# Patient Record
Sex: Female | Born: 1990 | Race: White | Hispanic: No | Marital: Married | Smoking: Never smoker
Health system: Southern US, Community
[De-identification: ages and names within clinical notes are randomized; demographics above are authoritative.]

## PROBLEM LIST (undated history)

## (undated) DIAGNOSIS — E669 Obesity, unspecified: Secondary | ICD-10-CM

## (undated) DIAGNOSIS — E282 Polycystic ovarian syndrome: Secondary | ICD-10-CM

## (undated) DIAGNOSIS — J45909 Unspecified asthma, uncomplicated: Secondary | ICD-10-CM

## (undated) HISTORY — PX: TONSILLECTOMY: SUR1361

## (undated) HISTORY — PX: BLADDER SURGERY: SHX569

---

## 2017-06-03 ENCOUNTER — Emergency Department (INDEPENDENT_AMBULATORY_CARE_PROVIDER_SITE_OTHER)
Admission: EM | Admit: 2017-06-03 | Discharge: 2017-06-03 | Disposition: A | Discharge status: Home / Self Care | Payer: Self-pay | Source: Home / Self Care | Attending: Family Medicine | Admitting: Family Medicine

## 2017-06-03 ENCOUNTER — Other Ambulatory Visit: Payer: Self-pay

## 2017-06-03 ENCOUNTER — Encounter: Payer: Self-pay | Admitting: Emergency Medicine

## 2017-06-03 DIAGNOSIS — J111 Influenza due to unidentified influenza virus with other respiratory manifestations: Secondary | ICD-10-CM

## 2017-06-03 DIAGNOSIS — R69 Illness, unspecified: Secondary | ICD-10-CM

## 2017-06-03 HISTORY — DX: Polycystic ovarian syndrome: E28.2

## 2017-06-03 MED ORDER — METHYLPREDNISOLONE SODIUM SUCC 125 MG IJ SOLR
80.0000 mg | Freq: Once | INTRAMUSCULAR | Status: AC
  Administered 2017-06-03: 80 mg via INTRAMUSCULAR

## 2017-06-03 MED ORDER — BENZONATATE 200 MG PO CAPS: ORAL_CAPSULE | ORAL | 0 refills | Status: DC

## 2017-06-03 MED ORDER — AZITHROMYCIN 250 MG PO TABS: ORAL_TABLET | ORAL | 0 refills | Status: DC

## 2017-06-03 MED ORDER — PREDNISONE 20 MG PO TABS: ORAL_TABLET | ORAL | 0 refills | Status: DC

## 2017-06-03 NOTE — ED Provider Notes (Signed)
[facility]    CSN: [id number] Arrival date & time: 06/03/17  1408     History   Chief Complaint Chief Complaint  Patient presents with  . Influenza    HPI Dawn Hill is a 27 y.o. female.   Complains of 3 day history flu-like illness including myalgias, headache, chills, fatigue, and cough.  Also has mild nasal congestion and sore throat.  Cough is non-productive and somewhat worse at night.  No pleuritic pain or shortness of breath.  She has a history of asthma and pneumonia and notes that her URI's tend to linger.  She has been using her albuterol inhaler more frequently.   The history is provided by the patient.    Past Medical History:  Diagnosis Date  . PCOS (polycystic ovarian syndrome)     There are no active problems to display for this patient.   Past Surgical History:  Procedure Laterality Date  . BLADDER SURGERY    . TONSILLECTOMY      OB History    No data available       Home Medications    Prior to Admission medications   Medication Sig Start Date End Date Taking? Authorizing Provider  albuterol (ACCUNEB) 0.63 MG/3ML nebulizer solution Take 1 ampule by nebulization every 6 (six) hours as needed for wheezing.   Yes [provider]  azithromycin (ZITHROMAX Z-PAK) 250 MG tablet Take 2 tabs today; then begin one tab once daily for 4 more days. 06/03/17   Lattie HawBeese, Tammie Yanda A, MD  benzonatate (TESSALON) 200 MG capsule Take one cap by mouth at bedtime as needed for cough.  May repeat in 4 to 6 hours 06/03/17   Lattie HawBeese, Shian Goodnow A, MD  predniSONE (DELTASONE) 20 MG tablet Take one tab by mouth twice daily for 4 days, then one daily for 3 days. Take with food. 06/03/17   Lattie HawBeese, Blessed Cotham A, MD    Family History Family History  Problem Relation Age of Onset  . Fibromyalgia Mother   . Rheum arthritis Mother   . Bladder Cancer Mother   . Diabetes Father   . Hypertension Father     Social History Social History   Tobacco Use  .  Smoking status: Never Smoker  . Smokeless tobacco: Current User  Substance Use Topics  . Alcohol use: Yes  . Drug use: No     Allergies   Patient has no known allergies.   Review of Systems Review of Systems + sore throat + hoarse + cough No pleuritic pain + wheezing + nasal congestion + post-nasal drainage No sinus pain/pressure No itchy/red eyes ? earache No hemoptysis No SOB No fever, + chills No nausea No vomiting No abdominal pain No diarrhea No urinary symptoms No skin rash + fatigue + myalgias + headache Used OTC meds without relief   Physical Exam Triage Vital Signs ED Triage Vitals  Enc Vitals Group     BP 06/03/17 1502 121/85     Pulse Rate 06/03/17 1502 88     Resp --      Temp 06/03/17 1502 98 F (36.7 C)     Temp Source 06/03/17 1502 Oral     SpO2 06/03/17 1502 97 %     Weight 06/03/17 1503 281 lb (127.5 kg)     Height 06/03/17 1503 4\' 11"  (1.499 m)     Head Circumference --      Peak Flow --      Pain Score 06/03/17 1503 3  Pain Loc --      Pain Edu? --      Excl. in GC? --    No data found.  Updated Vital Signs BP 121/85 (BP Location: Right Arm)   Pulse 88   Temp 98 F (36.7 C) (Oral)   Ht 4\' 11"  (1.499 m)   Wt 281 lb (127.5 kg)   LMP 05/07/2017 (Exact Date)   SpO2 97%   BMI 56.76 kg/m   Visual Acuity Right Eye Distance:   Left Eye Distance:   Bilateral Distance:    Right Eye Near:   Left Eye Near:    Bilateral Near:     Physical Exam Nursing notes and Vital Signs reviewed. Appearance:  Patient appears stated age, and in no acute distress Eyes:  Pupils are equal, round, and reactive to light and accomodation.  Extraocular movement is intact.  Conjunctivae are not inflamed  Ears:  Canals normal.  Tympanic membranes normal.  Nose:  Mildly congested turbinates.  No sinus tenderness. Pharynx:  Uvula mildly edematous. Neck:  Supple.  Enlarged posterior/lateral nodes are palpated bilaterally, tender to palpation on  the left.   Lungs:  Clear to auscultation.  Breath sounds are equal.  Moving air well. Heart:  Regular rate and rhythm without murmurs, rubs, or gallops.  Abdomen:  Nontender without masses or hepatosplenomegaly.  Bowel sounds are present.  No CVA or flank tenderness.  Extremities:  No edema.  Skin:  No rash present.    UC Treatments / Results  Labs (all labs ordered are listed, but only abnormal results are displayed) Labs Reviewed - No data to display  EKG  EKG Interpretation None       Radiology No results found.  Procedures Procedures (including critical care time)  Medications Ordered in UC Medications  methylPREDNISolone sodium succinate (SOLU-MEDROL) 125 mg/2 mL injection 80 mg (not administered)     Initial Impression / Assessment and Plan / UC Course  I have reviewed the triage vital signs and the nursing notes.  Pertinent labs & imaging results that were available during my care of the patient were reviewed by me and considered in my medical decision making (see chart for details).    Patient has missed 48 hour window of opportunity for beginning Tamiflu.  Will begin empiric Z-pak. Administered Solumedrol 80mg  IM Prescription written for Benzonatate (Tessalon) to take at bedtime for night-time cough.  Begin prednisone Monday 06/04/17. Take plain guaifenesin (1200mg  extended release tabs such as Mucinex) twice daily, with plenty of water, for cough and congestion.  May add Pseudoephedrine (30mg , one or two every 4 to 6 hours) for sinus congestion.  Get adequate rest.   May use Afrin nasal spray (or generic oxymetazoline) each morning for about 5 days and then discontinue.  Also recommend using saline nasal spray several times daily and saline nasal irrigation (AYR is a common brand).  Use Flonase nasal spray each morning after using Afrin nasal spray and saline nasal irrigation. Try warm salt water gargles for sore throat.  Stop all antihistamines for now, and  other non-prescription cough/cold preparations. Continue albuterol inhaler as needed. Followup with Family Doctor if not improved in about 8 days.    Final Clinical Impressions(s) / UC Diagnoses   Final diagnoses:  Influenza-like illness    ED Discharge Orders        Ordered    azithromycin (ZITHROMAX Z-PAK) 250 MG tablet     06/03/17 1534    predniSONE (DELTASONE) 20 MG tablet  06/03/17 1534    benzonatate (TESSALON) 200 MG capsule     06/03/17 1534           Lattie Haw, MD 06/08/17 1447

## 2017-06-03 NOTE — ED Triage Notes (Signed)
Body aches, cough, bi-lateral ear pain, fatigue, chills, SOB x 3 days

## 2017-06-03 NOTE — Discharge Instructions (Signed)
Begin prednisone Monday 06/04/17. Take plain guaifenesin (1200mg  extended release tabs such as Mucinex) twice daily, with plenty of water, for cough and congestion.  May add Pseudoephedrine (30mg , one or two every 4 to 6 hours) for sinus congestion.  Get adequate rest.   May use Afrin nasal spray (or generic oxymetazoline) each morning for about 5 days and then discontinue.  Also recommend using saline nasal spray several times daily and saline nasal irrigation (AYR is a common brand).  Use Flonase nasal spray each morning after using Afrin nasal spray and saline nasal irrigation. Try warm salt water gargles for sore throat.  Stop all antihistamines for now, and other non-prescription cough/cold preparations. Continue albuterol inhaler as needed.

## 2017-07-04 ENCOUNTER — Ambulatory Visit (HOSPITAL_COMMUNITY)
Admission: RE | Admit: 2017-07-04 | Discharge: 2017-07-04 | Disposition: A | Discharge status: Home / Self Care | Payer: Self-pay | Source: Ambulatory Visit | Attending: Vascular Surgery | Admitting: Vascular Surgery

## 2017-07-04 ENCOUNTER — Other Ambulatory Visit (HOSPITAL_COMMUNITY): Payer: Self-pay | Admitting: Gerontology

## 2017-07-04 DIAGNOSIS — S81832A Puncture wound without foreign body, left lower leg, initial encounter: Secondary | ICD-10-CM

## 2017-07-04 DIAGNOSIS — S71132A Puncture wound without foreign body, left thigh, initial encounter: Secondary | ICD-10-CM | POA: Insufficient documentation

## 2017-07-04 DIAGNOSIS — W540XXA Bitten by dog, initial encounter: Secondary | ICD-10-CM

## 2017-07-09 ENCOUNTER — Other Ambulatory Visit (HOSPITAL_COMMUNITY): Payer: Self-pay | Admitting: Gerontology

## 2017-07-09 ENCOUNTER — Ambulatory Visit (HOSPITAL_COMMUNITY)
Admission: RE | Admit: 2017-07-09 | Discharge: 2017-07-09 | Disposition: A | Discharge status: Home / Self Care | Payer: Self-pay | Source: Ambulatory Visit | Attending: Surgery | Admitting: Surgery

## 2017-07-09 DIAGNOSIS — S71152A Open bite, left thigh, initial encounter: Secondary | ICD-10-CM | POA: Insufficient documentation

## 2017-07-09 DIAGNOSIS — W540XXA Bitten by dog, initial encounter: Secondary | ICD-10-CM | POA: Insufficient documentation

## 2017-07-09 DIAGNOSIS — S71132A Puncture wound without foreign body, left thigh, initial encounter: Secondary | ICD-10-CM | POA: Insufficient documentation

## 2017-07-18 ENCOUNTER — Encounter (HOSPITAL_COMMUNITY): Payer: Self-pay

## 2017-07-18 ENCOUNTER — Emergency Department (HOSPITAL_COMMUNITY)
Admission: EM | Admit: 2017-07-18 | Discharge: 2017-07-19 | Disposition: A | Discharge status: Home / Self Care | Payer: Self-pay | Attending: Emergency Medicine | Admitting: Emergency Medicine

## 2017-07-18 DIAGNOSIS — R1031 Right lower quadrant pain: Secondary | ICD-10-CM

## 2017-07-18 DIAGNOSIS — Z3A01 Less than 8 weeks gestation of pregnancy: Secondary | ICD-10-CM | POA: Insufficient documentation

## 2017-07-18 DIAGNOSIS — O26891 Other specified pregnancy related conditions, first trimester: Secondary | ICD-10-CM | POA: Insufficient documentation

## 2017-07-18 DIAGNOSIS — Z3490 Encounter for supervision of normal pregnancy, unspecified, unspecified trimester: Secondary | ICD-10-CM

## 2017-07-18 LAB — COMPREHENSIVE METABOLIC PANEL
ALK PHOS: 57 U/L (ref 38–126)
ALT: 31 U/L (ref 14–54)
ANION GAP: 12 (ref 5–15)
AST: 26 U/L (ref 15–41)
Albumin: 4.1 g/dL (ref 3.5–5.0)
BUN: 6 mg/dL (ref 6–20)
CALCIUM: 9.6 mg/dL (ref 8.9–10.3)
CHLORIDE: 103 mmol/L (ref 101–111)
CO2: 21 mmol/L — AB (ref 22–32)
Creatinine, Ser: 0.71 mg/dL (ref 0.44–1.00)
GFR calc non Af Amer: >60 mL/min (ref >60)
Glucose, Bld: 78 mg/dL (ref 65–99)
Potassium: 3.6 mmol/L (ref 3.5–5.1)
SODIUM: 136 mmol/L (ref 135–145)
Total Bilirubin: 1.4 mg/dL — ABNORMAL HIGH (ref 0.3–1.2)
Total Protein: 8.2 g/dL — ABNORMAL HIGH (ref 6.5–8.1)

## 2017-07-18 LAB — CBC
HCT: 42.8 % (ref 36.0–46.0)
HEMOGLOBIN: 14.4 g/dL (ref 12.0–15.0)
MCH: 29.9 pg (ref 26.0–34.0)
MCHC: 33.6 g/dL (ref 30.0–36.0)
MCV: 89 fL (ref 78.0–100.0)
Platelets: 328 10*3/uL (ref 150–400)
RBC: 4.81 MIL/uL (ref 3.87–5.11)
RDW: 13.2 % (ref 11.5–15.5)
WBC: 13.3 10*3/uL — ABNORMAL HIGH (ref 4.0–10.5)

## 2017-07-18 LAB — I-STAT BETA HCG BLOOD, ED (MC, WL, AP ONLY): I-stat hCG, quantitative: >2000 m[IU]/mL — ABNORMAL HIGH (ref <5)

## 2017-07-18 LAB — LIPASE, BLOOD: Lipase: 26 U/L (ref 11–51)

## 2017-07-18 LAB — URINALYSIS, ROUTINE W REFLEX MICROSCOPIC
Bilirubin Urine: NEGATIVE
GLUCOSE, UA: NEGATIVE mg/dL
Ketones, ur: 20 mg/dL — AB
Nitrite: NEGATIVE
PROTEIN: NEGATIVE mg/dL
Specific Gravity, Urine: 1.012 (ref 1.005–1.030)
pH: 6 (ref 5.0–8.0)

## 2017-07-18 NOTE — ED Provider Notes (Signed)
[facility] EMERGENCY DEPARTMENT Provider Note   CSN: [id number] Arrival date & time: 07/18/17  2002     History   Chief Complaint Chief Complaint  Patient presents with  . Abdominal Pain    HPI Dawn Hill is a 27 y.o. female.  HPI   Reports has had abdominal pain since Friday, initially off and on after eating. Nausea since Friday. Has been taking zofran which hasn't helped. Was on abx for 3wk for dog bite.  Constant pain began yesterday. Stabbing pain going from umbilicus to RLQ.  No vomiting but severe nausea. No diarrhea. No BM in 2 days but is normal.  No vaginal bleeding or discharge.  January was LMP, has PCOS.  LMP around 1/15.  Past Medical History:  Diagnosis Date  . PCOS (polycystic ovarian syndrome)     There are no active problems to display for this patient.   Past Surgical History:  Procedure Laterality Date  . BLADDER SURGERY    . TONSILLECTOMY       OB History   None      Home Medications    Prior to Admission medications   Medication Sig Start Date End Date Taking? Authorizing Provider  albuterol (PROVENTIL HFA;VENTOLIN HFA) 108 (90 Base) MCG/ACT inhaler Inhale 1-2 puffs into the lungs every 6 (six) hours as needed for wheezing or shortness of breath.   Yes [provider]  ondansetron (ZOFRAN) 4 MG tablet Take 4 mg by mouth every 6 (six) hours as needed for nausea or vomiting.   Yes [provider]  oxyCODONE (ROXICODONE) 5 MG immediate release tablet Take 5 mg by mouth every 4 (four) hours as needed for severe pain.   Yes [provider]  azithromycin (ZITHROMAX Z-PAK) 250 MG tablet Take 2 tabs today; then begin one tab once daily for 4 more days. Patient not taking: Reported on 07/18/2017 06/03/17   Lattie Haw, MD  benzonatate (TESSALON) 200 MG capsule Take one cap by mouth at bedtime as needed for cough.  May repeat in 4 to 6 hours Patient not taking: Reported on 07/18/2017 06/03/17   Lattie Haw, MD  predniSONE (DELTASONE) 20 MG tablet Take one tab by mouth twice daily for 4 days, then one daily for 3 days. Take with food. Patient not taking: Reported on 07/18/2017 06/03/17   Lattie Haw, MD    Family History Family History  Problem Relation Age of Onset  . Fibromyalgia Mother   . Rheum arthritis Mother   . Bladder Cancer Mother   . Diabetes Father   . Hypertension Father     Social History Social History   Tobacco Use  . Smoking status: Never Smoker  . Smokeless tobacco: Current User  Substance Use Topics  . Alcohol use: Yes  . Drug use: No     Allergies   Ibuprofen   Review of Systems Review of Systems  Constitutional: Positive for appetite change and fatigue. Negative for fever.  HENT: Negative for sore throat.   Eyes: Negative for visual disturbance.  Respiratory: Negative for cough and shortness of breath.   Cardiovascular: Negative for chest pain.  Gastrointestinal: Positive for abdominal pain, constipation and nausea. Negative for diarrhea and vomiting.  Genitourinary: Negative for difficulty urinating, dysuria, vaginal bleeding and vaginal discharge.  Musculoskeletal: Negative for back pain and neck pain.  Skin: Negative for rash.  Neurological: Negative for syncope and headaches.     Physical Exam Updated Vital Signs BP 121/73  Pulse 87   Temp 98.3 F (36.8 C) (Oral)   Resp 16   Ht 4\' 11"  (1.499 m)   Wt 127.9 kg (282 lb)   SpO2 97%   BMI 56.96 kg/m   Physical Exam  Constitutional: She is oriented to person, place, and time. She appears well-developed and well-nourished. No distress.  HENT:  Head: Normocephalic and atraumatic.  Eyes: Conjunctivae and EOM are normal.  Neck: Normal range of motion.  Cardiovascular: Normal rate, regular rhythm, normal heart sounds and intact distal pulses. Exam reveals no gallop and no friction rub.  No murmur heard. Pulmonary/Chest: Effort normal and breath sounds normal. No respiratory  distress. She has no wheezes. She has no rales.  Abdominal: Soft. She exhibits no distension. There is tenderness in the right lower quadrant. There is no guarding and negative Murphy's sign.  Musculoskeletal: She exhibits no edema or tenderness.  Neurological: She is alert and oriented to person, place, and time.  Skin: Skin is warm and dry. No rash noted. She is not diaphoretic. No erythema.  Nursing note and vitals reviewed.    ED Treatments / Results  Labs (all labs ordered are listed, but only abnormal results are displayed) Labs Reviewed  COMPREHENSIVE METABOLIC PANEL - Abnormal; Notable for the following components:      Result Value   CO2 21 (*)    Total Protein 8.2 (*)    Total Bilirubin 1.4 (*)    All other components within normal limits  CBC - Abnormal; Notable for the following components:   WBC 13.3 (*)    All other components within normal limits  URINALYSIS, ROUTINE W REFLEX MICROSCOPIC - Abnormal; Notable for the following components:   APPearance HAZY (*)    Hgb urine dipstick LARGE (*)    Ketones, ur 20 (*)    Leukocytes, UA MODERATE (*)    Bacteria, UA RARE (*)    Squamous Epithelial / LPF 6-30 (*)    All other components within normal limits  HCG, QUANTITATIVE, PREGNANCY - Abnormal; Notable for the following components:   hCG, Beta Chain, Quant, S 50,115 (*)    All other components within normal limits  I-STAT BETA HCG BLOOD, ED ([facility], [facility], [facility] ONLY) - Abnormal; Notable for the following components:   I-stat hCG, quantitative >2,000.0 (*)    All other components within normal limits  URINE CULTURE  LIPASE, BLOOD  ABO/RH    EKG None  Radiology US Pelvic Doppler (torsion R/o Or Mass Arterial Flow)  Result Date: 07/19/2017 CLINICAL DATA:  Right lower quadrant abdominal pain. Estimated gestational age by LMP is 11 weeks 2 days. Quantitative beta HCG is 50,115 EXAM: OBSTETRIC <14 WK [place] AND TRANSVAGINAL OB US DOPPLER ULTRASOUND OF OVARIES TECHNIQUE: Both  transabdominal and transvaginal ultrasound examinations were performed for complete evaluation of the gestation as well as the maternal uterus, adnexal regions, and pelvic cul-de-sac. Transvaginal technique was performed to assess early pregnancy. Color and duplex Doppler ultrasound was utilized to evaluate blood flow to the ovaries. COMPARISON:  None. FINDINGS: Intrauterine gestational sac: A single intrauterine gestational sac is present. Yolk sac:  Yolk sac is identified. Embryo:  Fetal pole is present. Cardiac Activity: Fetal cardiac activity is observed. Heart Rate: 123 bpm CRL: 8.3 mm   6 w 6 d                  [place] EDC: 03/08/2018 Subchorionic hemorrhage:  None visualized. Maternal uterus/adnexae: Uterus is anteverted. No myometrial mass lesions identified.  Small nabothian cysts in the cervix. Both ovaries are identified and have normal appearance. No abnormal adnexal masses. No abnormal pelvic fluid collections. Pulsed Doppler evaluation of both ovaries demonstrates normal appearing low-resistance arterial and venous waveforms. IMPRESSION: 1. Single intrauterine pregnancy. Estimated gestational age by crown-rump length is 6 weeks 6 days. No acute complication is identified sonographically. 2. Normal ultrasound appearance of the ovaries. No evidence of ovarian torsion. Electronically Signed   By: Burman Nieves M.D.   On: 07/19/2017 02:29   US Ob Less Than 14 Weeks With Ob Transvaginal  Result Date: 07/19/2017 CLINICAL DATA:  Right lower quadrant abdominal pain. Estimated gestational age by LMP is 11 weeks 2 days. Quantitative beta HCG is 50,115 EXAM: OBSTETRIC <14 WK [place] AND TRANSVAGINAL OB US DOPPLER ULTRASOUND OF OVARIES TECHNIQUE: Both transabdominal and transvaginal ultrasound examinations were performed for complete evaluation of the gestation as well as the maternal uterus, adnexal regions, and pelvic cul-de-sac. Transvaginal technique was performed to assess early pregnancy. Color and duplex Doppler  ultrasound was utilized to evaluate blood flow to the ovaries. COMPARISON:  None. FINDINGS: Intrauterine gestational sac: A single intrauterine gestational sac is present. Yolk sac:  Yolk sac is identified. Embryo:  Fetal pole is present. Cardiac Activity: Fetal cardiac activity is observed. Heart Rate: 123 bpm CRL: 8.3 mm   6 w 6 d                  [place] EDC: 03/08/2018 Subchorionic hemorrhage:  None visualized. Maternal uterus/adnexae: Uterus is anteverted. No myometrial mass lesions identified. Small nabothian cysts in the cervix. Both ovaries are identified and have normal appearance. No abnormal adnexal masses. No abnormal pelvic fluid collections. Pulsed Doppler evaluation of both ovaries demonstrates normal appearing low-resistance arterial and venous waveforms. IMPRESSION: 1. Single intrauterine pregnancy. Estimated gestational age by crown-rump length is 6 weeks 6 days. No acute complication is identified sonographically. 2. Normal ultrasound appearance of the ovaries. No evidence of ovarian torsion. Electronically Signed   By: Burman Nieves M.D.   On: 07/19/2017 02:29    Procedures Procedures (including critical care time)  Medications Ordered in ED Medications - No data to display   Initial Impression / Assessment and Plan / ED Course  I have reviewed the triage vital signs and the nursing notes.  Pertinent labs & imaging results that were available during my care of the patient were reviewed by me and considered in my medical decision making (see chart for details).      27 year old female with a history of PCOS presents with concern for right lower quadrant pain.  Pregnancy test positive with LMP in January.  Ultrasound was completed which shows single intrauterine pregnancy 6wk 6d.  No torsion. Heterotopic pregnancy with ectopic very unlikely and no sign of adnexal abnormalities on [place].  Urinalysis shows contamination, however possible urinary tract infection. Will call in keflex to  patient's pharmacy. Overall, patient is afebrile with intermittent symptoms for nearly one week followed by constant pain and feel it is unlikely her pain represents appendicitis, however have given her strict return precautions. No RUQ pain to suggest cholecystitis or cholelithiasis.  Pain may be related to pregnancy, possible UTI.  Recommend unisom/b6 for nausea, prenatal vitamins, tylenol for pain.  Called in keflex for 7 days. Patient discharged in stable condition with understanding of reasons to return.    Final Clinical Impressions(s) / ED Diagnoses   Final diagnoses:  Right lower quadrant abdominal pain  Intrauterine pregnancy    ED  Discharge Orders    None       Alvira MondaySchlossman, Lastacia Solum, MD 07/19/17 Windell [id number]

## 2017-07-18 NOTE — ED Triage Notes (Signed)
Pt states that since Friday she has been unable to eat due to nausea and sharp pain in lower R abd, reports no BM in two days. No fevers

## 2017-07-19 ENCOUNTER — Other Ambulatory Visit (HOSPITAL_COMMUNITY): Payer: Self-pay

## 2017-07-19 ENCOUNTER — Emergency Department (HOSPITAL_COMMUNITY): Payer: Self-pay

## 2017-07-19 LAB — ABO/RH: ABO/RH(D): O POS

## 2017-07-19 LAB — HCG, QUANTITATIVE, PREGNANCY: hCG, Beta Chain, Quant, S: 50115 m[IU]/mL — ABNORMAL HIGH

## 2017-07-19 NOTE — Discharge Instructions (Addendum)
You may take over the counter unisom (doxylamine) 25mg  nightly and vitamin B6 for nausea.

## 2017-07-19 NOTE — ED Notes (Addendum)
Pt states nausea still strongly present, but is requesting a Sprite.

## 2017-07-19 NOTE — ED Notes (Signed)
Patient transported to Ultrasound 

## 2017-07-19 NOTE — ED Notes (Signed)
ED Provider at bedside. 

## 2017-07-21 LAB — URINE CULTURE: Culture: >=100000 — AB

## 2017-07-22 ENCOUNTER — Telehealth: Payer: Self-pay

## 2017-07-22 NOTE — Progress Notes (Signed)
ED Antimicrobial Stewardship Positive Culture Follow Up   Ennis Fortsmber Mccloud is an 27 y.o. female who [facility] with a chief complaint of  Chief Complaint  Patient presents with  . Abdominal Pain    Recent Results (from the past 720 hour(s))  Urine culture     Status: Abnormal   Collection Time: 07/19/17  1:23 AM  Result Value Ref Range Status   Specimen Description URINE, RANDOM  Final   Special Requests NONE  Final   Culture (A)  Final    >=100,000 COLONIES/mL ENTEROBACTER CLOACAE >=100,000 COLONIES/mL DIPHTHEROIDS(CORYNEBACTERIUM SPECIES) Standardized susceptibility testing for this organism is not available. Performed at [facility], [address] [place], [place] [address]    Report Status 07/21/2017 FINAL  Final   Organism ID, Bacteria ENTEROBACTER CLOACAE (A)  Final      Susceptibility   Enterobacter cloacae - MIC*    CEFAZOLIN >=64 RESISTANT Resistant     CEFTRIAXONE <=1 SENSITIVE Sensitive     CIPROFLOXACIN <=0.25 SENSITIVE Sensitive     GENTAMICIN <=1 SENSITIVE Sensitive     IMIPENEM <=0.25 SENSITIVE Sensitive     NITROFURANTOIN 32 SENSITIVE Sensitive     TRIMETH/SULFA <=20 SENSITIVE Sensitive     PIP/TAZO 16 SENSITIVE Sensitive     * >=100,000 COLONIES/mL ENTEROBACTER CLOACAE    [x]  Treated with Keflex, organism resistant to prescribed antimicrobial  New antibiotic prescription: D/c Keflex and change to Macrobid 100 mg po bid x 7 days (safe in pregnancy). Ensure follow-up with OBGYN.  ED Provider: Sharen Hecklaudia Gibbons, PA-C  Rolley SimsMartin, Kupono Marling Ann 07/22/2017, 10:10 AM Infectious Diseases Pharmacist Phone# [phone]

## 2017-07-22 NOTE — Telephone Encounter (Signed)
Post ED Visit - Positive Culture Follow-up: Successful Patient Follow-Up  Culture assessed and recommendations reviewed by: []  Enzo BiNathan Batchelder, Pharm.D. []  Celedonio MiyamotoJeremy Frens, Pharm.D., BCPS AQ-ID []  Garvin FilaMike Maccia, Pharm.D., BCPS [x]  Georgina PillionElizabeth Martin, Pharm.D., BCPS []  BrentMinh Pham, [address]Pharm.D., BCPS, AAHIVP []  Estella HuskMichelle Turner, Pharm.D., BCPS, AAHIVP []  Lysle Pearlachel Rumbarger, PharmD, BCPS []  Casilda Carlsaylor Stone, PharmD, BCPS []  Pollyann SamplesAndy Johnston, PharmD, BCPS  Positive urine culture  []  Patient discharged without antimicrobial prescription and treatment is now indicated [x]  Organism is resistant to prescribed ED discharge antimicrobial []  Patient with positive blood cultures  Changes discussed with ED provider: Sharen [facility]laudia Gibbons [facility] New antibiotic prescription Macrobid 100 mg BID x 7 days [facility] [phone]  Contacted patient, date 07/22/17, time 1111   Jerry CarasCullom, Shatavia Santor Burnett 07/22/2017, 11:09 AM

## 2017-08-16 ENCOUNTER — Encounter: Payer: Self-pay | Admitting: Vascular Surgery

## 2020-01-24 ENCOUNTER — Emergency Department (INDEPENDENT_AMBULATORY_CARE_PROVIDER_SITE_OTHER)
Admission: RE | Admit: 2020-01-24 | Discharge: 2020-01-24 | Disposition: A | Discharge status: Home / Self Care | Payer: BC Managed Care – PPO | Source: Ambulatory Visit

## 2020-01-24 ENCOUNTER — Other Ambulatory Visit: Payer: Self-pay

## 2020-01-24 VITALS — BP 152/95 | HR 98 | Temp 98.4°F | Resp 16 | Ht 59.75 in | Wt 290.0 lb

## 2020-01-24 DIAGNOSIS — R3129 Other microscopic hematuria: Secondary | ICD-10-CM | POA: Diagnosis not present

## 2020-01-24 DIAGNOSIS — R300 Dysuria: Secondary | ICD-10-CM

## 2020-01-24 LAB — POCT URINALYSIS DIP (MANUAL ENTRY)
Bilirubin, UA: NEGATIVE
Glucose, UA: NEGATIVE mg/dL
Ketones, POC UA: NEGATIVE mg/dL
Leukocytes, UA: NEGATIVE
Nitrite, UA: NEGATIVE
Protein Ur, POC: NEGATIVE mg/dL
Spec Grav, UA: >=1.03 — AB (ref 1.010–1.025)
Urobilinogen, UA: 0.2 E.U./dL
pH, UA: 5 (ref 5.0–8.0)

## 2020-01-24 LAB — POCT URINE PREGNANCY: Preg Test, Ur: NEGATIVE

## 2020-01-24 MED ORDER — FLUCONAZOLE 150 MG PO TABS: 150.0000 mg | ORAL_TABLET | Freq: Once | ORAL | 0 refills | Status: AC

## 2020-01-24 MED ORDER — CEPHALEXIN 500 MG PO CAPS: 500.0000 mg | ORAL_CAPSULE | Freq: Three times a day (TID) | ORAL | 0 refills | Status: DC

## 2020-01-24 MED ORDER — PHENAZOPYRIDINE HCL 200 MG PO TABS: 200.0000 mg | ORAL_TABLET | Freq: Three times a day (TID) | ORAL | 0 refills | Status: DC | PRN

## 2020-01-24 NOTE — ED Triage Notes (Signed)
Patient here for day 2 of dysuria, urgency; working on fertility with PCOS and slightly late menses this month. Has not had covid vaccination.

## 2020-01-24 NOTE — ED Provider Notes (Signed)
[facility]    CSN: [id number] Arrival date & time: 01/24/20  1151      History   Chief Complaint Chief Complaint  Patient presents with  . Dysuria    HPI Dawn [facility] is a 29 y.o. female.   This is an established [place] urgent care patient, 29 year old woman, who presents with symptoms of urinary tract infection.  Patient has had multiple urinary tract infections since she was a child.  She has no back pain, fever, nausea, vomiting, or rigors.  Patient here for day 2 of dysuria, urgency; working on fertility with PCOS and slightly late menses this month. Has not had covid vaccination.     Past Medical History:  Diagnosis Date  . PCOS (polycystic ovarian syndrome)   . PCOS (polycystic ovarian syndrome)     There are no problems to display for this patient.   Past Surgical History:  Procedure Laterality Date  . BLADDER SURGERY    . TONSILLECTOMY      OB History   No obstetric history on file.      Home Medications    Prior to Admission medications   Medication Sig Start Date End Date Taking? Authorizing Provider  loratadine (CLARITIN) 10 MG tablet Take 10 mg by mouth daily.   Yes [provider]  metFORMIN (GLUCOPHAGE) 500 MG tablet Take by mouth daily with breakfast.   Yes [provider]  Prenatal Vit-Fe Fumarate-FA (PRENATAL 1 PLUS 1 PO) Take by mouth.   Yes [provider]  albuterol (PROVENTIL HFA;VENTOLIN HFA) 108 (90 Base) MCG/ACT inhaler Inhale 1-2 puffs into the lungs every 6 (six) hours as needed for wheezing or shortness of breath.    [provider]  cephALEXin (KEFLEX) 500 MG capsule Take 1 capsule (500 mg total) by mouth 3 (three) times daily. 01/24/20   Elvina Sidle, MD  fluconazole (DIFLUCAN) 150 MG tablet Take 1 tablet (150 mg total) by mouth once for 1 dose. Repeat if needed 01/24/20 01/24/20  Elvina Sidle, MD  phenazopyridine (PYRIDIUM) 200 MG tablet Take 1 tablet (200 mg total) by  mouth 3 (three) times daily as needed for pain. 01/24/20   Elvina Sidle, MD    Family History Family History  Problem Relation Age of Onset  . Fibromyalgia Mother   . Rheum arthritis Mother   . Bladder Cancer Mother   . Diabetes Father   . Hypertension Father     Social History Social History   Tobacco Use  . Smoking status: Never Smoker  . Smokeless tobacco: Current User  Substance Use Topics  . Alcohol use: Yes  . Drug use: No     Allergies   Ibuprofen   Review of Systems Review of Systems  Genitourinary: Positive for dysuria and frequency.  All other systems reviewed and are negative.    Physical Exam Triage Vital Signs ED Triage Vitals  Enc Vitals Group     BP 01/24/20 1212 (!) 152/95     Pulse Rate 01/24/20 1212 98     Resp 01/24/20 1212 16     Temp 01/24/20 1212 98.4 F (36.9 C)     Temp Source 01/24/20 1212 Oral     SpO2 01/24/20 1212 98 %     Weight 01/24/20 1215 290 lb (131.5 kg)     Height 01/24/20 1215 4' 11.75" (1.518 m)     Head Circumference --      Peak Flow --      Pain Score 01/24/20 1214 7  Pain Loc --      Pain Edu? --      Excl. in GC? --    No data found.  Updated Vital Signs BP (!) 152/95 (BP Location: Right Wrist)   Pulse 98   Temp 98.4 F (36.9 C) (Oral)   Resp 16   Ht 4' 11.75" (1.518 m)   Wt 131.5 kg   LMP 12/17/2019 (Exact Date)   SpO2 98%   BMI 57.11 kg/m    Physical Exam Vitals and nursing note reviewed.  Constitutional:      Appearance: Normal appearance. She is obese.  Eyes:     Conjunctiva/sclera: Conjunctivae normal.  Pulmonary:     Effort: Pulmonary effort is normal.  Musculoskeletal:        General: Normal range of motion.     Cervical back: Normal range of motion and neck supple.  Skin:    General: Skin is warm and dry.  Neurological:     General: No focal deficit present.     Mental Status: She is alert and oriented to person, place, and time.  Psychiatric:        Mood and Affect: Mood  normal.        Behavior: Behavior normal.        Thought Content: Thought content normal.        Judgment: Judgment normal.      UC Treatments / Results  Labs (all labs ordered are listed, but only abnormal results are displayed) Labs Reviewed  POCT URINALYSIS DIP (MANUAL ENTRY) - Abnormal; Notable for the following components:      Result Value   Spec Grav, UA >=1.030 (*)    Blood, UA moderate (*)    All other components within normal limits  URINE CULTURE  POCT URINE PREGNANCY    EKG   Radiology No results found.  Procedures Procedures (including critical care time)  Medications Ordered in UC Medications - No data to display  Initial Impression / Assessment and Plan / UC Course  I have reviewed the triage vital signs and the nursing notes.  Pertinent labs & imaging results that were available during my care of the patient were reviewed by me and considered in my medical decision making (see chart for details).    Final Clinical Impressions(s) / UC Diagnoses   Final diagnoses:  Dysuria  Microscopic hematuria   Discharge Instructions   None    ED Prescriptions    Medication Sig Dispense Auth. Provider   cephALEXin (KEFLEX) 500 MG capsule Take 1 capsule (500 mg total) by mouth 3 (three) times daily. 21 capsule Elvina Sidle, MD   fluconazole (DIFLUCAN) 150 MG tablet Take 1 tablet (150 mg total) by mouth once for 1 dose. Repeat if needed 2 tablet Elvina Sidle, MD   phenazopyridine (PYRIDIUM) 200 MG tablet Take 1 tablet (200 mg total) by mouth 3 (three) times daily as needed for pain. 10 tablet Elvina Sidle, MD     I have reviewed the PDMP during this encounter.   Elvina Sidle, MD 01/24/20 1230

## 2020-01-26 LAB — URINE CULTURE
MICRO NUMBER:: 11054171
SPECIMEN QUALITY:: ADEQUATE

## 2020-01-29 ENCOUNTER — Emergency Department (INDEPENDENT_AMBULATORY_CARE_PROVIDER_SITE_OTHER)
Admission: RE | Admit: 2020-01-29 | Discharge: 2020-01-29 | Disposition: A | Discharge status: Home / Self Care | Payer: BC Managed Care – PPO | Source: Ambulatory Visit

## 2020-01-29 ENCOUNTER — Telehealth: Payer: Self-pay

## 2020-01-29 ENCOUNTER — Other Ambulatory Visit: Payer: Self-pay

## 2020-01-29 ENCOUNTER — Emergency Department (INDEPENDENT_AMBULATORY_CARE_PROVIDER_SITE_OTHER): Payer: BC Managed Care – PPO

## 2020-01-29 VITALS — BP 128/77 | HR 97 | Temp 100.1°F | Resp 19 | Ht 59.0 in | Wt 290.0 lb

## 2020-01-29 DIAGNOSIS — R3129 Other microscopic hematuria: Secondary | ICD-10-CM

## 2020-01-29 DIAGNOSIS — R109 Unspecified abdominal pain: Secondary | ICD-10-CM | POA: Diagnosis not present

## 2020-01-29 HISTORY — DX: Obesity, unspecified: E66.9

## 2020-01-29 HISTORY — DX: Unspecified asthma, uncomplicated: J45.909

## 2020-01-29 LAB — POCT URINALYSIS DIP (MANUAL ENTRY)
Bilirubin, UA: NEGATIVE
Glucose, UA: NEGATIVE mg/dL
Ketones, POC UA: NEGATIVE mg/dL
Leukocytes, UA: NEGATIVE
Nitrite, UA: NEGATIVE
Protein Ur, POC: NEGATIVE mg/dL
Spec Grav, UA: >=1.03 — AB (ref 1.010–1.025)
Urobilinogen, UA: 0.2 E.U./dL
pH, UA: 6 (ref 5.0–8.0)

## 2020-01-29 LAB — POCT URINE PREGNANCY: Preg Test, Ur: NEGATIVE

## 2020-01-29 MED ORDER — CIPROFLOXACIN HCL 500 MG PO TABS: 250.0000 mg | ORAL_TABLET | Freq: Two times a day (BID) | ORAL | 0 refills | Status: DC

## 2020-01-29 MED ORDER — CIPROFLOXACIN HCL 250 MG PO TABS: 250.0000 mg | ORAL_TABLET | Freq: Two times a day (BID) | ORAL | 0 refills | Status: DC

## 2020-01-29 NOTE — Telephone Encounter (Signed)
Pharmacy changed per pt request

## 2020-01-29 NOTE — ED Triage Notes (Signed)
Pt presents f/up from 10/09 when she was treated for UTI with Keflex x10 days. Currently day 5/10. She reports bilateral flank pain that radiates to pelvis. Rates 10/10 "stabbing" pain. Dysuria as well. No otc today.

## 2020-01-29 NOTE — Discharge Instructions (Addendum)
While the antibiotic you were on appears to be appropriate for your most recent UTI, a new antibiotic has been prescribed to help better treat the infection due to your worsening symptoms. Be sure to take this medication with food to help prevent stomach upset.   Please take your antibiotic as prescribed. A urine culture has been sent to check the severity of your urinary infection and to determine if you are on the most appropriate antibiotic. The results should come back within 2-3 days. You will only be notified if a medication change is indicated.  Please follow up with family medicine or urology if not improving within 1 week, sooner if symptoms worsening.    Call 911 or have someone drive you to the hospital if symptoms significantly worsening.

## 2020-01-29 NOTE — Telephone Encounter (Signed)
Per pharmacy, 250mg  tablets of cipro are on back order. Per [place], medication changed to 500mg  tablets broken in half. Still take 250mg  by mouth twice per day.

## 2020-01-29 NOTE — ED Provider Notes (Signed)
[facility]    CSN: [id number] Arrival date & time: 01/29/20  1354      History   Chief Complaint Chief Complaint  Patient presents with  . Flank Pain    bilateral radiates to pelvic pain    HPI [facility] is a 29 y.o. female.   HPI  [facility] is a 28 y.o. female presenting to UC with c/o intermittent bilateral flank pain that radiates into lower abdomen.  She was seen at [facility] on 01/24/20, dx with a UTI, started on 10 day course of Keflex. Pt is on day 5.  Symptoms were improving until about 2 days ago flank pain started.  Pain is stabbing, 10/10 at worst.  Continued dysuria.  Denies known fever but had a temp of 100.1*F in triage. Denies current abdominal pain or vaginal symptoms. Pt had negative urine pregnancy test last week.    Past Medical History:  Diagnosis Date  . Asthma   . Obesity   . PCOS (polycystic ovarian syndrome)   . PCOS (polycystic ovarian syndrome)     There are no problems to display for this patient.   Past Surgical History:  Procedure Laterality Date  . BLADDER SURGERY    . TONSILLECTOMY      OB History   No obstetric history on file.      Home Medications    Prior to Admission medications   Medication Sig Start Date End Date Taking? Authorizing Provider  albuterol (PROVENTIL HFA;VENTOLIN HFA) 108 (90 Base) MCG/ACT inhaler Inhale 1-2 puffs into the lungs every 6 (six) hours as needed for wheezing or shortness of breath.    [provider]  ciprofloxacin (CIPRO) 250 MG tablet Take 1 tablet (250 mg total) by mouth 2 (two) times daily for 5 days. 01/29/20 02/03/20  Lurene Shadow, PA-C  loratadine (CLARITIN) 10 MG tablet Take 10 mg by mouth daily.    [provider]  metFORMIN (GLUCOPHAGE) 500 MG tablet Take by mouth daily with breakfast.    [provider]  phenazopyridine (PYRIDIUM) 200 MG tablet Take 1 tablet (200 mg total) by mouth 3 (three) times daily as needed for pain. 01/24/20    Elvina Sidle, MD  Prenatal Vit-Fe Fumarate-FA (PRENATAL 1 PLUS 1 PO) Take by mouth.    [provider]    Family History Family History  Problem Relation Age of Onset  . Fibromyalgia Mother   . Rheum arthritis Mother   . Bladder Cancer Mother   . Diabetes Father   . Hypertension Father     Social History Social History   Tobacco Use  . Smoking status: Never Smoker  . Smokeless tobacco: Current User  Substance Use Topics  . Alcohol use: Yes  . Drug use: No     Allergies   Ibuprofen   Review of Systems Review of Systems  Constitutional: Positive for fever. Negative for chills.  HENT: Negative for congestion, ear pain, sore throat, trouble swallowing and voice change.   Respiratory: Negative for cough and shortness of breath.   Cardiovascular: Negative for chest pain and palpitations.  Gastrointestinal: Positive for abdominal pain. Negative for diarrhea, nausea and vomiting.  Genitourinary: Positive for dysuria and flank pain. Negative for frequency, hematuria, urgency, vaginal bleeding, vaginal discharge and vaginal pain.  Musculoskeletal: Negative for arthralgias, back pain and myalgias.  Skin: Negative for rash.  All other systems reviewed and are negative.    Physical Exam Triage Vital Signs ED Triage Vitals  Enc Vitals Group  BP 01/29/20 1422 128/77     Pulse Rate 01/29/20 1422 97     Resp 01/29/20 1422 19     Temp 01/29/20 1422 100.1 F (37.8 C)     Temp Source 01/29/20 1422 Oral     SpO2 01/29/20 1422 98 %     Weight 01/29/20 1419 290 lb (131.5 kg)     Height 01/29/20 1419 4\' 11"  (1.499 m)     Head Circumference --      Peak Flow --      Pain Score 01/29/20 1424 10     Pain Loc --      Pain Edu? --      Excl. in GC? --    No data found.  Updated Vital Signs BP 128/77 (BP Location: Left Arm)   Pulse 97   Temp 100.1 F (37.8 C) (Oral)   Resp 19   Ht 4\' 11"  (1.499 m)   Wt 290 lb (131.5 kg)   LMP 12/17/2019 (Exact Date)   SpO2  98%   BMI 58.57 kg/m   Visual Acuity Right Eye Distance:   Left Eye Distance:   Bilateral Distance:    Right Eye Near:   Left Eye Near:    Bilateral Near:     Physical Exam Vitals and nursing note reviewed.  Constitutional:      General: She is not in acute distress.    Appearance: Normal appearance. She is well-developed. She is obese. She is not ill-appearing, toxic-appearing or diaphoretic.  HENT:     Head: Normocephalic and atraumatic.  Cardiovascular:     Rate and Rhythm: Normal rate and regular rhythm.  Pulmonary:     Effort: Pulmonary effort is normal. No respiratory distress.     Breath sounds: Normal breath sounds.  Abdominal:     General: There is no distension.     Palpations: Abdomen is soft.     Tenderness: There is no abdominal tenderness. There is no right CVA tenderness or left CVA tenderness.  Musculoskeletal:        General: Normal range of motion.     Cervical back: Normal range of motion.  Skin:    General: Skin is warm and dry.  Neurological:     Mental Status: She is alert and oriented to person, place, and time.  Psychiatric:        Behavior: Behavior normal.      UC Treatments / Results  Labs (all labs ordered are listed, but only abnormal results are displayed) Labs Reviewed  POCT URINALYSIS DIP (MANUAL ENTRY) - Abnormal; Notable for the following components:      Result Value   Color, UA other (*)    Clarity, UA cloudy (*)    Spec Grav, UA >=1.030 (*)    Blood, UA small (*)    All other components within normal limits  POCT URINE PREGNANCY    EKG   Radiology CT Renal Stone Study  Result Date: 01/29/2020 CLINICAL DATA:  Bilateral flank pain.  Recent UTI EXAM: CT ABDOMEN AND PELVIS WITHOUT CONTRAST TECHNIQUE: Multidetector CT imaging of the abdomen and pelvis was performed following the standard protocol without IV contrast. COMPARISON:  None. FINDINGS: Lower chest: No acute abnormality. Hepatobiliary: Unremarkable unenhanced  appearance of the liver. No focal liver lesion identified. Gallbladder is partially contracted but appears otherwise unremarkable. No biliary dilatation. Pancreas: Unremarkable. No pancreatic ductal dilatation or surrounding inflammatory changes. Spleen: Normal in size without focal abnormality. Adrenals/Urinary Tract: Unremarkable adrenal glands. Bilateral  kidneys have an unremarkable unenhanced appearance. No perinephric stranding or fluid. No renal lesion, stone, or hydronephrosis. Bilateral ureters are normal in appearance. No ureteral calculi. Urinary bladder is decompressed, limiting its evaluation. Stomach/Bowel: Stomach is within normal limits. Appendix appears normal. No evidence of bowel wall thickening, distention, or inflammatory changes. Vascular/Lymphatic: No significant vascular findings are identified. No enlarged abdominal or pelvic lymph nodes. Reproductive: Uterus and bilateral adnexa are unremarkable. Other: No free fluid. No abdominopelvic fluid collection. No pneumoperitoneum. No abdominal wall hernia. Musculoskeletal: No acute or significant osseous findings. IMPRESSION: No acute abdominopelvic findings. Specifically, no evidence of obstructive uropathy. Please note that evaluation for pyelonephritis is limited in the absence of intravenous contrast. Electronically Signed   By: Duanne Guess D.O.   On: 01/29/2020 15:34    Procedures Procedures (including critical care time)  Medications Ordered in UC Medications - No data to display  Initial Impression / Assessment and Plan / UC Course  I have reviewed the triage vital signs and the nursing notes.  Pertinent labs & imaging results that were available during my care of the patient were reviewed by me and considered in my medical decision making (see chart for details).     Reassured pt of no renal stones. UA still shows small hematuria New culture sent Will change pt to Cipro due to possible early pyelonephritis    Encouraged f/u with PCP Discussed symptoms that warrant emergent care in the ED. AVS given  Final Clinical Impressions(s) / UC Diagnoses   Final diagnoses:  Flank pain  Microscopic hematuria     Discharge Instructions     While the antibiotic you were on appears to be appropriate for your most recent UTI, a new antibiotic has been prescribed to help better treat the infection due to your worsening symptoms. Be sure to take this medication with food to help prevent stomach upset.   Please take your antibiotic as prescribed. A urine culture has been sent to check the severity of your urinary infection and to determine if you are on the most appropriate antibiotic. The results should come back within 2-3 days. You will only be notified if a medication change is indicated.  Please follow up with family medicine or urology if not improving within 1 week, sooner if symptoms worsening.    Call 911 or have someone drive you to the hospital if symptoms significantly worsening.      ED Prescriptions    Medication Sig Dispense Auth. Provider   ciprofloxacin (CIPRO) 250 MG tablet Take 1 tablet (250 mg total) by mouth 2 (two) times daily for 5 days. 10 tablet Lurene Shadow, PA-C     PDMP not reviewed this encounter.   Lurene Shadow, [place] 01/29/20 1641

## 2021-10-31 IMAGING — CT CT RENAL STONE PROTOCOL
2 of 4 series · 16 of 46 positions shown, 18 images · non-contrast
Comparison: None.

CLINICAL DATA: Bilateral flank pain.  Recent UTI

EXAM:
CT ABDOMEN AND PELVIS WITHOUT CONTRAST
TECHNIQUE: Multidetector CT imaging of the abdomen and pelvis was performed
following the standard protocol without IV contrast.

[Series 2: axial st · axial · 0.96mm/px · z∈[-662,-177]mm · 13 of 107 slices shown, 15 images]
[im 5/107  soft-tissue]
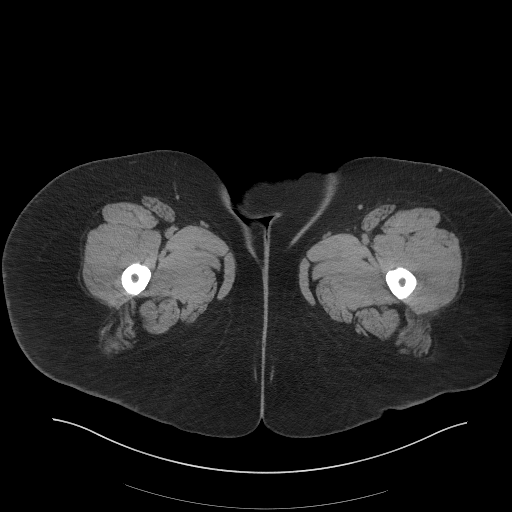
[im 5/107  bone]
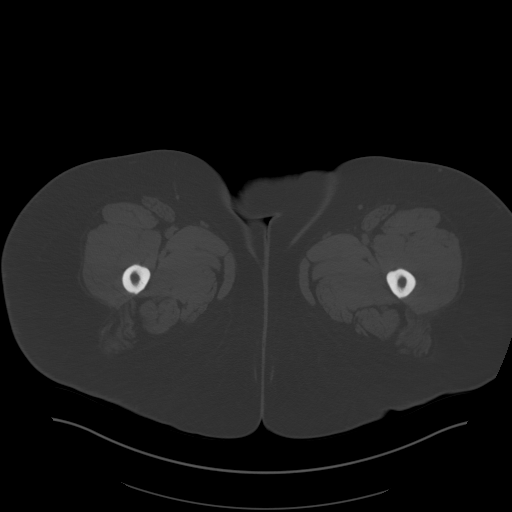
[im 14/107  soft-tissue]
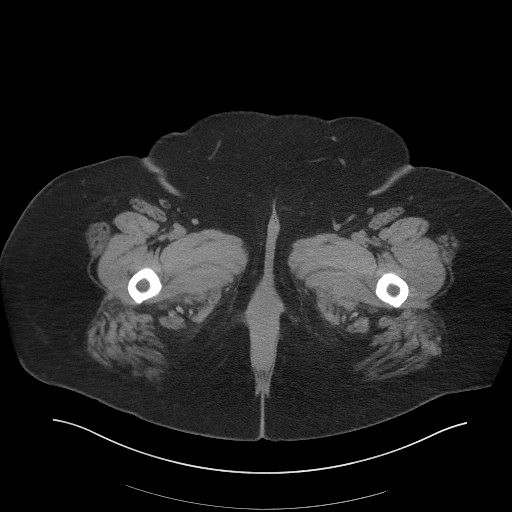
[im 23/107  soft-tissue]
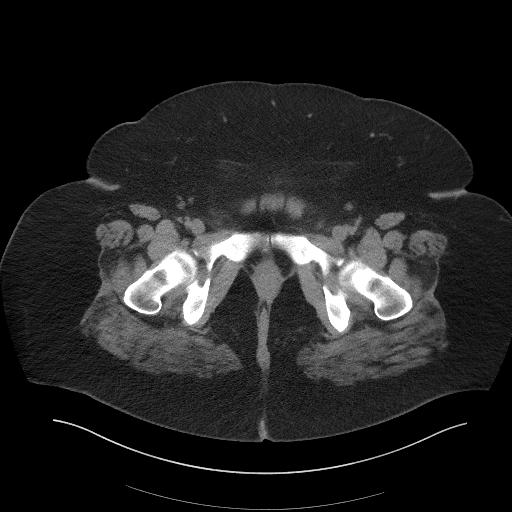
[im 31/107  soft-tissue]
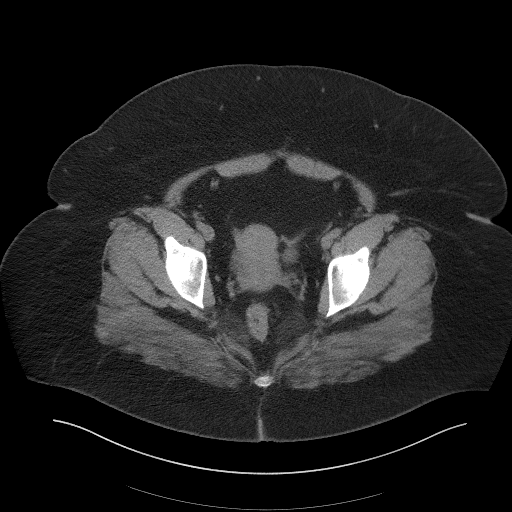
[im 36/107  soft-tissue]
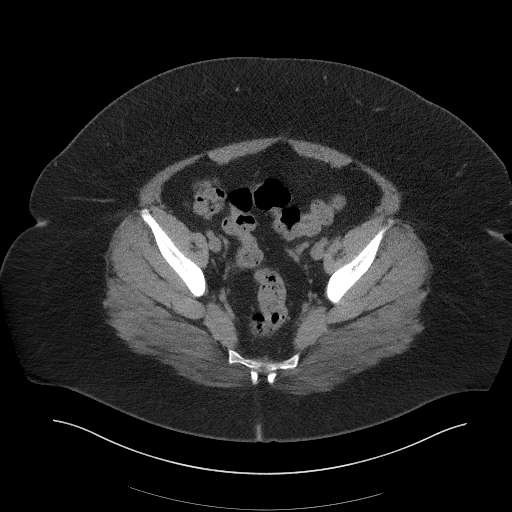
[im 45/107  soft-tissue]
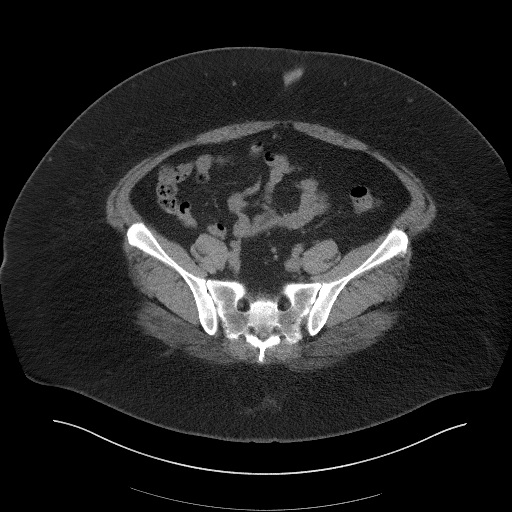
[im 54/107  soft-tissue]
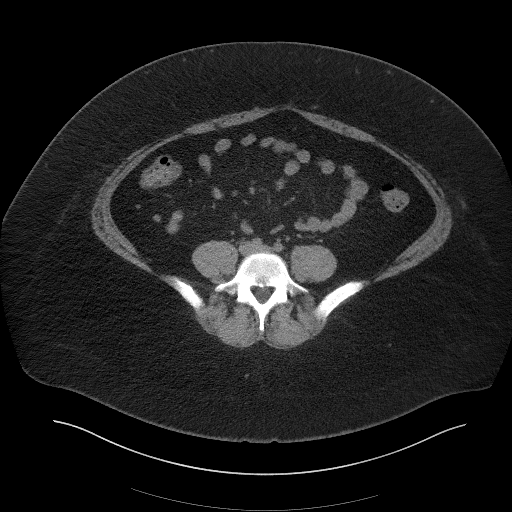
[im 62/107  soft-tissue]
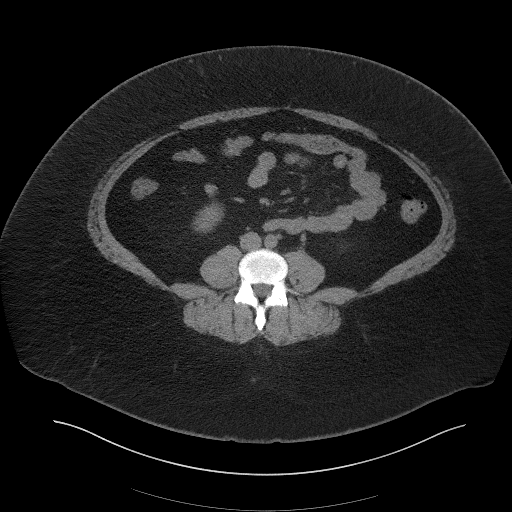
[im 71/107  soft-tissue]
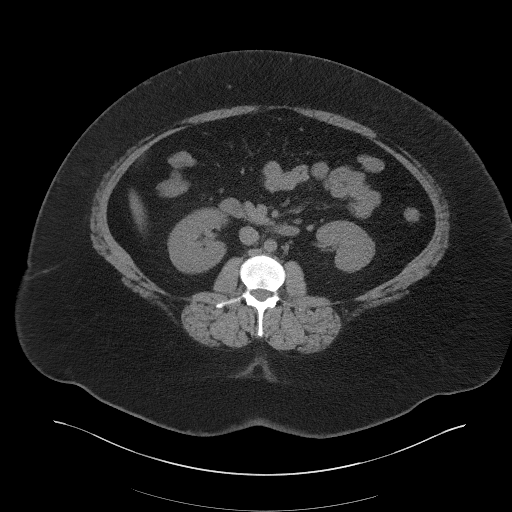
[im 71/107  bone]
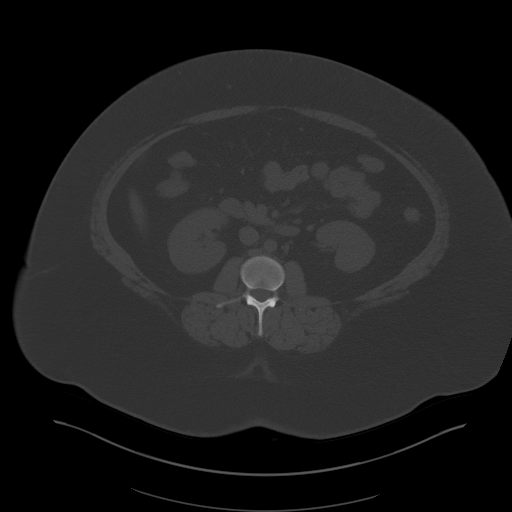
[im 76/107  soft-tissue]
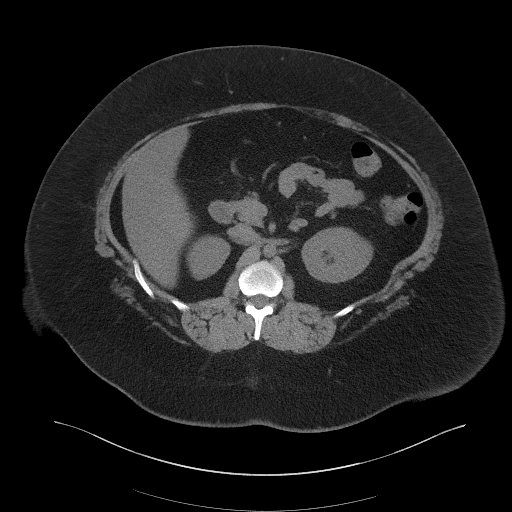
[im 84/107  soft-tissue]
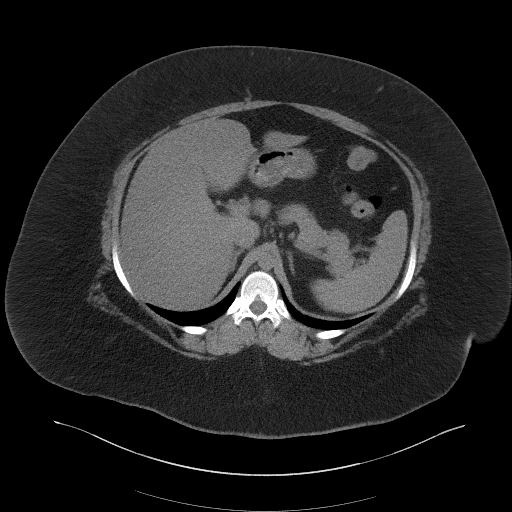
[im 93/107  soft-tissue]
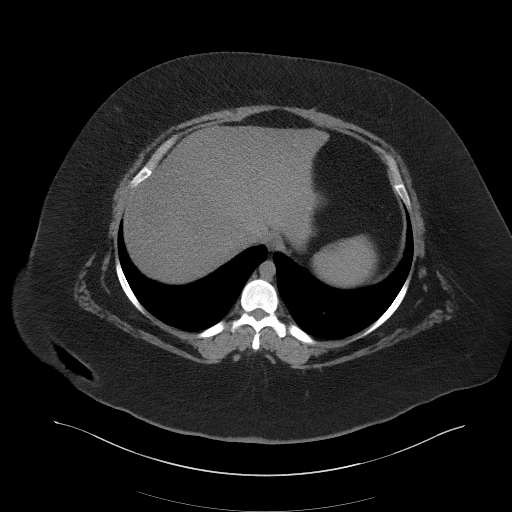
[im 102/107  soft-tissue]
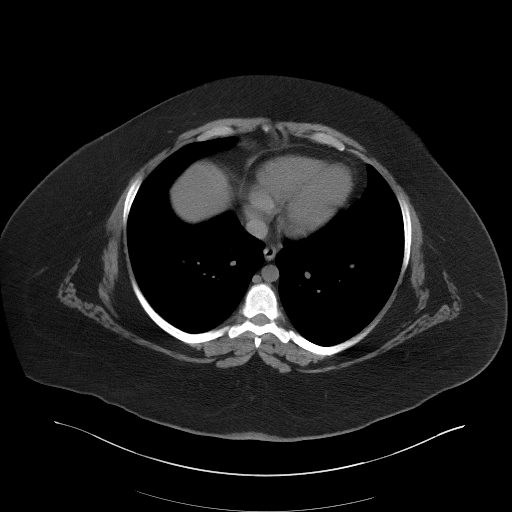

[Series 4: coronal st · coronal · 0.82mm/px · 3 of 128 slices shown]
[im 43/128  soft-tissue]
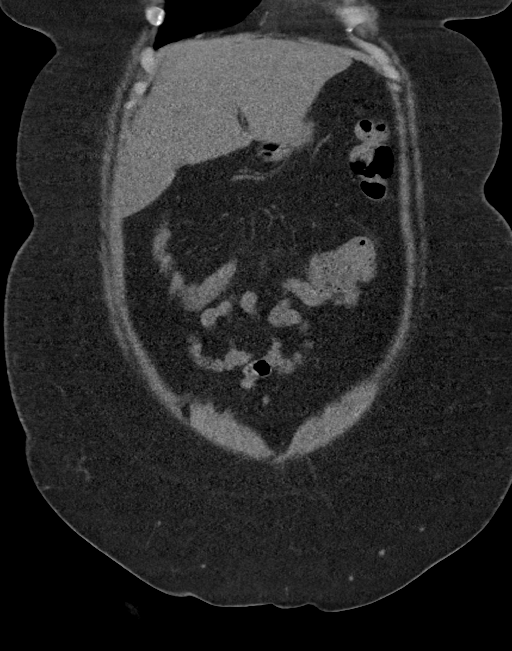
[im 57/128  soft-tissue]
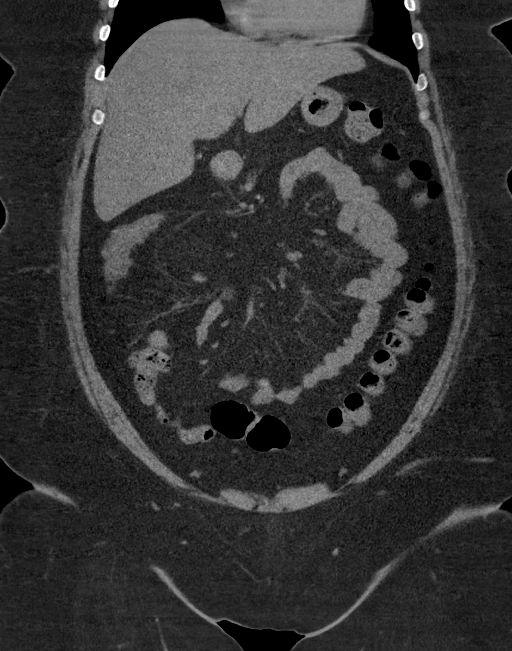
[im 71/128  soft-tissue]
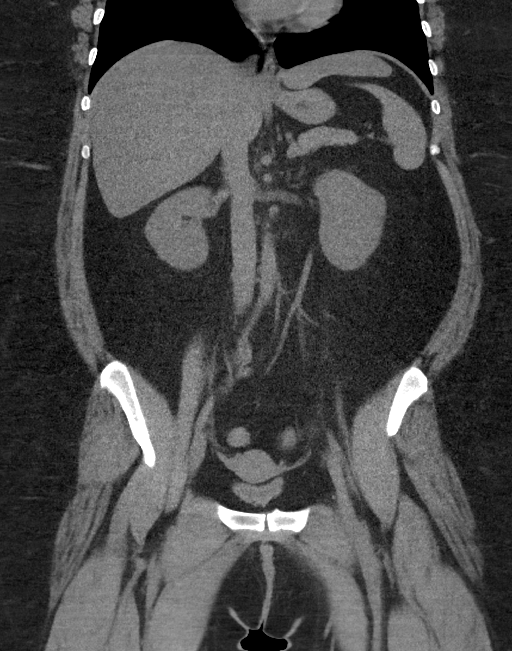

[16 of 46 positions shown; findings below may reference images not displayed]

FINDINGS: Lower chest: No acute abnormality.

Hepatobiliary: Unremarkable unenhanced appearance of the liver. No
focal liver lesion identified. Gallbladder is partially contracted
but appears otherwise unremarkable. No biliary dilatation.

Pancreas: Unremarkable. No pancreatic ductal dilatation or
surrounding inflammatory changes.

Spleen: Normal in size without focal abnormality.

Adrenals/Urinary Tract: Unremarkable adrenal glands. Bilateral
kidneys have an unremarkable unenhanced appearance. No perinephric
stranding or fluid. No renal lesion, stone, or hydronephrosis.
Bilateral ureters are normal in appearance. No ureteral calculi.
Urinary bladder is decompressed, limiting its evaluation.

Stomach/Bowel: Stomach is within normal limits. Appendix appears
normal. No evidence of bowel wall thickening, distention, or
inflammatory changes.

Vascular/Lymphatic: No significant vascular findings are identified.
No enlarged abdominal or pelvic lymph nodes.

Reproductive: Uterus and bilateral adnexa are unremarkable.

Other: No free fluid. No abdominopelvic fluid collection. No
pneumoperitoneum. No abdominal wall hernia.

Musculoskeletal: No acute or significant osseous findings.
IMPRESSION: No acute abdominopelvic findings. Specifically, no evidence of
obstructive uropathy. Please note that evaluation for pyelonephritis
is limited in the absence of intravenous contrast.

## 2022-06-11 ENCOUNTER — Encounter: Payer: Self-pay | Admitting: Emergency Medicine

## 2022-06-11 ENCOUNTER — Other Ambulatory Visit: Payer: Self-pay

## 2022-06-11 ENCOUNTER — Ambulatory Visit: Admit: 2022-06-11 | Payer: BC Managed Care – PPO

## 2022-06-11 ENCOUNTER — Ambulatory Visit
Admission: EM | Admit: 2022-06-11 | Discharge: 2022-06-11 | Disposition: A | Discharge status: Home / Self Care | Payer: BC Managed Care – PPO | Attending: Emergency Medicine | Admitting: Emergency Medicine

## 2022-06-11 DIAGNOSIS — J309 Allergic rhinitis, unspecified: Secondary | ICD-10-CM

## 2022-06-11 MED ORDER — METHYLPREDNISOLONE ACETATE 80 MG/ML IJ SUSP
80.0000 mg | Freq: Once | INTRAMUSCULAR | Status: AC
  Administered 2022-06-11: 80 mg via INTRAMUSCULAR

## 2022-06-11 MED ORDER — FLUTICASONE PROPIONATE 50 MCG/ACT NA SUSP: 1.0000 | Freq: Every day | NASAL | 2 refills | Status: AC

## 2022-06-11 MED ORDER — ALBUTEROL SULFATE HFA 108 (90 BASE) MCG/ACT IN AERS: 2.0000 | INHALATION_SPRAY | Freq: Four times a day (QID) | RESPIRATORY_TRACT | 2 refills | Status: AC | PRN

## 2022-06-11 MED ORDER — LORATADINE 10 MG PO TABS: 10.0000 mg | ORAL_TABLET | Freq: Every day | ORAL | 1 refills | Status: AC

## 2022-06-11 NOTE — ED Provider Notes (Signed)
[facility]    CSN: [id number] Arrival date & time: 06/11/22  1528    HISTORY   Chief Complaint  Patient presents with   Nasal Congestion   Cough   HPI Dawn Hill is a pleasant, 32 y.o. female who presents to urgent care today. Patient complains of nasal congestion, clear rhinorrhea, nonproductive cough for the past 4 days.  Patient reports a history of allergies and asthma, states not currently using any Claritin as previously prescribed.  Patient denies fever, body aches, chills, nausea, vomiting, diarrhea, known sick contacts.  Patient's heart rate is elevated on arrival, otherwise she is afebrile and has normal O2 sats.  Patient states has been taking over-the-counter cold and flu preparations and has used her albuterol inhaler once which she states helped a little bit.    The history is provided by the patient.   Past Medical History:  Diagnosis Date   Asthma    Obesity    PCOS (polycystic ovarian syndrome)    There are no problems to display for this patient.  Past Surgical History:  Procedure Laterality Date   BLADDER SURGERY     TONSILLECTOMY     OB History   No obstetric history on file.    Home Medications    Prior to Admission medications   Medication Sig Start Date End Date Taking? Authorizing Provider  albuterol (PROVENTIL HFA;VENTOLIN HFA) 108 (90 Base) MCG/ACT inhaler Inhale 1-2 puffs into the lungs every 6 (six) hours as needed for wheezing or shortness of breath.    [provider]  ciprofloxacin (CIPRO) 500 MG tablet Take 0.5 tablets (250 mg total) by mouth every 12 (twelve) hours. 01/29/20   Noe Gens, PA-C  loratadine (CLARITIN) 10 MG tablet Take 10 mg by mouth daily.    [provider]  metFORMIN (GLUCOPHAGE) 500 MG tablet Take by mouth daily with breakfast.    [provider]  phenazopyridine (PYRIDIUM) 200 MG tablet Take 1 tablet (200 mg total) by mouth 3 (three) times daily as needed for pain.  01/24/20   Robyn Haber, MD  Prenatal Vit-Fe Fumarate-FA (PRENATAL 1 PLUS 1 PO) Take by mouth.    [provider]    Family History Family History  Problem Relation Age of Onset   Fibromyalgia Mother    Rheum arthritis Mother    Bladder Cancer Mother    Diabetes Father    Hypertension Father    Macular degeneration Father    Social History Social History   Tobacco Use   Smoking status: Never   Smokeless tobacco: Current  Substance Use Topics   Alcohol use: Yes   Drug use: No   Allergies   Ibuprofen  Review of Systems Review of Systems Pertinent findings revealed after performing a 14 point review of systems has been noted in the history of present illness.  Physical Exam Vital Signs BP (!) 137/100 (BP Location: Right Arm)   Pulse (!) 105   Temp 97.6 F (36.4 C) (Oral)   Resp 20   Ht '4\' 11"'$  (1.499 m)   Wt 218 lb (98.9 kg)   LMP 06/10/2022   SpO2 100%   BMI 44.03 kg/m   No data found.  Physical Exam Vitals and nursing note reviewed.  Constitutional:      General: She is not in acute distress.    Appearance: Normal appearance. She is not ill-appearing.  HENT:     Head: Normocephalic and atraumatic.     Salivary Glands: Right  salivary gland is not diffusely enlarged or tender. Left salivary gland is not diffusely enlarged or tender.     Right Ear: Ear canal and external ear normal. No drainage. A middle ear effusion is present. There is no impacted cerumen. Tympanic membrane is bulging. Tympanic membrane is not injected or erythematous.     Left Ear: Ear canal and external ear normal. No drainage. A middle ear effusion is present. There is no impacted cerumen. Tympanic membrane is bulging. Tympanic membrane is not injected or erythematous.     Ears:     Comments: Bilateral EACs normal, both TMs bulging with clear fluid    Nose: Rhinorrhea present. No nasal deformity, septal deviation, signs of injury, nasal tenderness, mucosal edema or congestion.  Rhinorrhea is clear.     Right Nostril: Occlusion present. No foreign body, epistaxis or septal hematoma.     Left Nostril: Occlusion present. No foreign body, epistaxis or septal hematoma.     Right Turbinates: Enlarged, swollen and pale.     Left Turbinates: Enlarged, swollen and pale.     Right Sinus: No maxillary sinus tenderness or frontal sinus tenderness.     Left Sinus: No maxillary sinus tenderness or frontal sinus tenderness.     Mouth/Throat:     Lips: Pink. No lesions.     Mouth: Mucous membranes are moist. No oral lesions.     Pharynx: Oropharynx is clear. Uvula midline. No posterior oropharyngeal erythema or uvula swelling.     Tonsils: No tonsillar exudate. 0 on the right. 0 on the left.     Comments: Postnasal drip Eyes:     General: Lids are normal.        Right eye: No discharge.        Left eye: No discharge.     Extraocular Movements: Extraocular movements intact.     Conjunctiva/sclera: Conjunctivae normal.     Right eye: Right conjunctiva is not injected.     Left eye: Left conjunctiva is not injected.  Neck:     Trachea: Trachea and phonation normal.  Cardiovascular:     Rate and Rhythm: Normal rate and regular rhythm.     Pulses: Normal pulses.     Heart sounds: Normal heart sounds. No murmur heard.    No friction rub. No gallop.  Pulmonary:     Effort: Pulmonary effort is normal. No accessory muscle usage, prolonged expiration or respiratory distress.     Breath sounds: Normal breath sounds. No stridor, decreased air movement or transmitted upper airway sounds. No decreased breath sounds, wheezing, rhonchi or rales.  Chest:     Chest wall: No tenderness.  Musculoskeletal:        General: Normal range of motion.     Cervical back: Normal range of motion and neck supple. Normal range of motion.  Lymphadenopathy:     Cervical: No cervical adenopathy.  Skin:    General: Skin is warm and dry.     Findings: No erythema or rash.  Neurological:     General:  No focal deficit present.     Mental Status: She is alert and oriented to person, place, and time.  Psychiatric:        Mood and Affect: Mood normal.        Behavior: Behavior normal.     Visual Acuity Right Eye Distance:   Left Eye Distance:   Bilateral Distance:    Right Eye Near:   Left Eye Near:    Bilateral Near:  UC Couse / Diagnostics / Procedures:     Radiology No results found.  Procedures Procedures (including critical care time) EKG  Pending results:  Labs Reviewed - No data to display  Medications Ordered in UC: Medications  methylPREDNISolone acetate (DEPO-MEDROL) injection 80 mg (has no administration in time range)    UC Diagnoses / Final Clinical Impressions(s)   I have reviewed the triage vital signs and the nursing notes.  Pertinent labs & imaging results that were available during my care of the patient were reviewed by me and considered in my medical decision making (see chart for details).    Final diagnoses:  None   *** Please see discharge instructions below for further details of plan of care as provided to patient. ED Prescriptions     Medication Sig Dispense Auth. Provider   loratadine (CLARITIN) 10 MG tablet Take 1 tablet (10 mg total) by mouth daily. 90 tablet Lynden Oxford Scales, PA-C   albuterol (VENTOLIN HFA) 108 (90 Base) MCG/ACT inhaler Inhale 2 puffs into the lungs every 6 (six) hours as needed for wheezing or shortness of breath (Cough). 18 g Lynden Oxford Scales, PA-C   fluticasone (FLONASE) 50 MCG/ACT nasal spray Place 1 spray into both nostrils daily. Begin by using 2 sprays in each nare daily for 3 to 5 days, then decrease to 1 spray in each nare daily. 15.8 mL Lynden Oxford Scales, PA-C      PDMP not reviewed this encounter.  Disposition Upon Discharge:  Condition: stable for discharge home Home: take medications as prescribed; routine discharge instructions as discussed; follow up as advised.  Patient  presented with an acute illness with associated systemic symptoms and significant discomfort requiring urgent management. In my opinion, this is a condition that a prudent lay person (someone who possesses an average knowledge of health and medicine) may potentially expect to result in complications if not addressed urgently such as respiratory distress, impairment of bodily function or dysfunction of bodily organs.   Routine symptom specific, illness specific and/or disease specific instructions were discussed with the patient and/or caregiver at length.   As such, the patient has been evaluated and assessed, work-up was performed and treatment was provided in alignment with urgent care protocols and evidence based medicine.  Patient/parent/caregiver has been advised that the patient may require follow up for further testing and treatment if the symptoms continue in spite of treatment, as clinically indicated and appropriate.  If the patient was tested for COVID-19, Influenza and/or RSV, then the patient/parent/guardian was advised to isolate at home pending the results of his/her diagnostic coronavirus test and potentially longer if they're positive. I have also advised pt that if his/her COVID-19 test returns positive, it's recommended to self-isolate for at least 10 days after symptoms first appeared AND until fever-free for 24 hours without fever reducer AND other symptoms have improved or resolved. Discussed self-isolation recommendations as well as instructions for household member/close contacts as per the [facility] and  DHHS, and also gave patient the [place] packet with this information.  Patient/parent/caregiver has been advised to return to the [facility] or PCP in 3-5 days if no better; to PCP or the Emergency Department if new signs and symptoms develop, or if the current signs or symptoms continue to change or worsen for further workup, evaluation and treatment as clinically indicated and  appropriate  The patient will follow up with their current PCP if and as advised. If the patient does not currently have a PCP we will  assist them in obtaining one.   The patient may need specialty follow up if the symptoms continue, in spite of conservative treatment and management, for further workup, evaluation, consultation and treatment as clinically indicated and appropriate.  Patient/parent/caregiver verbalized understanding and agreement of plan as discussed.  All questions were addressed during visit.  Please see discharge instructions below for further details of plan.  Discharge Instructions:   Discharge Instructions      Your symptoms and my physical exam findings are concerning for exacerbation of your underlying allergies.     Please read below to learn more about the medications, dosages and frequencies that I recommend to help alleviate your symptoms and to get you feeling better soon:  Depo-Medrol IM (methylprednisolone):  To quickly address your significant respiratory inflammation, you were provided with an injection of Solu-Medrol in the office today.  You should continue to feel the full benefit of the steroid for the next 4 to 6 hours.    Claritin (loratadine): This is an excellent second-generation antihistamine that helps to reduce respiratory inflammatory response to environmental allergens.  This medication is not known to cause daytime sleepiness so it can be taken in the daytime.  If you find that it does make you sleepy, please feel free to take it at bedtime.   Flonase (fluticasone): This is a steroid nasal spray that used once daily, 1 spray in each nare.  This works best when used on a daily basis. This medication does not work well if it is only used when you think you need it.  After 3 to 5 days of use, you will notice significant reduction of the inflammation and mucus production that is currently being caused by exposure to allergens, whether seasonal or  environmental.  The most common side effect of this medication is nosebleeds.  If you experience a nosebleed, please discontinue use for 1 week, then feel free to resume.  If you find that your insurance will not pay for this medication, please consider a different nasal steroids such as Nasonex (mometasone), or Nasacort (triamcinolone).   ProAir, Ventolin, Proventil (albuterol): This inhaled medication contains a short acting beta agonist bronchodilator.  This medication relaxes the smooth muscle of the airway in the lungs.  When these muscles are tight, breathing becomes more constricted.  The result of relaxation of the smooth muscle is increased air movement and improved work of breathing.  This is a short acting medication that can be used every 4-6 hours as needed for increased work of breathing, shortness of breath, wheezing and excessive coughing.  It comes in the form of a handheld inhaler or nebulizer solution.  I recommended that for the next 3 to 4 days, this medication is used 4 times daily on a scheduled basis then decrease to twice daily and as needed until symptoms have completely resolved which I anticipate will be several weeks.   If symptoms have not meaningfully improved in the next 5 to 7 days, please return for repeat evaluation or follow-up with your regular provider.  If symptoms have worsened in the next 3 to 5 days, please return for repeat evaluation or follow-up with your regular provider.    Thank you for visiting urgent care today.  We appreciate the opportunity to participate in your care.       This office note has been dictated using Museum/gallery curator.  Unfortunately, this method of dictation can sometimes lead to typographical or grammatical errors.  I apologize for  your inconvenience in advance if this occurs.  Please do not hesitate to reach out to me if clarification is needed.

## 2022-06-11 NOTE — Discharge Instructions (Addendum)
Your symptoms and my physical exam findings are concerning for exacerbation of your underlying allergies.     Please read below to learn more about the medications, dosages and frequencies that I recommend to help alleviate your symptoms and to get you feeling better soon:  Depo-Medrol IM (methylprednisolone):  To quickly address your significant respiratory inflammation, you were provided with an injection of Solu-Medrol in the office today.  You should continue to feel the full benefit of the steroid for the next 4 to 6 hours.    Claritin (loratadine): This is an excellent second-generation antihistamine that helps to reduce respiratory inflammatory response to environmental allergens.  This medication is not known to cause daytime sleepiness so it can be taken in the daytime.  If you find that it does make you sleepy, please feel free to take it at bedtime.   Flonase (fluticasone): This is a steroid nasal spray that used once daily, 1 spray in each nare.  This works best when used on a daily basis. This medication does not work well if it is only used when you think you need it.  After 3 to 5 days of use, you will notice significant reduction of the inflammation and mucus production that is currently being caused by exposure to allergens, whether seasonal or environmental.  The most common side effect of this medication is nosebleeds.  If you experience a nosebleed, please discontinue use for 1 week, then feel free to resume.  If you find that your insurance will not pay for this medication, please consider a different nasal steroids such as Nasonex (mometasone), or Nasacort (triamcinolone).   ProAir, Ventolin, Proventil (albuterol): This inhaled medication contains a short acting beta agonist bronchodilator.  This medication relaxes the smooth muscle of the airway in the lungs.  When these muscles are tight, breathing becomes more constricted.  The result of relaxation of the smooth muscle is increased  air movement and improved work of breathing.  This is a short acting medication that can be used every 4-6 hours as needed for increased work of breathing, shortness of breath, wheezing and excessive coughing.  It comes in the form of a handheld inhaler or nebulizer solution.  I recommended that for the next 3 to 4 days, this medication is used 4 times daily on a scheduled basis then decrease to twice daily and as needed until symptoms have completely resolved which I anticipate will be several weeks.   If symptoms have not meaningfully improved in the next 5 to 7 days, please return for repeat evaluation or follow-up with your regular provider.  If symptoms have worsened in the next 3 to 5 days, please return for repeat evaluation or follow-up with your regular provider.    Thank you for visiting urgent care today.  We appreciate the opportunity to participate in your care.

## 2022-06-11 NOTE — ED Triage Notes (Signed)
Pt presents to Urgent Care with c/o nasal congestion and cough x 4 days; afebrile. Has not done COVID test.

## 2022-06-12 ENCOUNTER — Telehealth: Payer: Self-pay | Admitting: Emergency Medicine

## 2022-06-12 NOTE — Telephone Encounter (Signed)
[place].  Advised if not doing well to disregard the call.  Any questions or concerns, feel free to contact the office.

## 2022-12-15 ENCOUNTER — Ambulatory Visit
Admission: RE | Admit: 2022-12-15 | Discharge: 2022-12-15 | Disposition: A | Discharge status: Home / Self Care | Payer: BC Managed Care – PPO | Source: Ambulatory Visit | Attending: Family Medicine | Admitting: Family Medicine

## 2022-12-15 VITALS — BP 118/82 | HR 116 | Temp 98.3°F | Resp 17

## 2022-12-15 DIAGNOSIS — U071 COVID-19: Secondary | ICD-10-CM | POA: Diagnosis not present

## 2022-12-15 LAB — POC INFLUENZA A AND B ANTIGEN (URGENT CARE ONLY)
Influenza A Ag: NEGATIVE
Influenza B Ag: NEGATIVE

## 2022-12-15 LAB — POC SARS CORONAVIRUS 2 AG -  ED: SARS Coronavirus 2 Ag: POSITIVE — AB

## 2022-12-15 MED ORDER — AZITHROMYCIN 250 MG PO TABS: ORAL_TABLET | ORAL | 0 refills | Status: AC

## 2022-12-15 NOTE — ED Triage Notes (Addendum)
Pt c/o cough, congestion and sore throat that started Monday. Bilateral ear pain as well. Fatigue and bodyaches started Wed. Fever 101 last night. Flu exposure at work. Taking OTC cold and flu and mucinex prn. Requesting work note

## 2022-12-15 NOTE — ED Provider Notes (Signed)
[facility]    CSN: [id number] Arrival date & time: 12/15/22  1732      History   Chief Complaint Chief Complaint  Patient presents with   Sore Throat   Cough   Nasal Congestion    HPI [facility] is a 32 y.o. female.   HPI  Past Medical History:  Diagnosis Date   Asthma    Obesity    PCOS (polycystic ovarian syndrome)     There are no problems to display for this patient.   Past Surgical History:  Procedure Laterality Date   BLADDER SURGERY     TONSILLECTOMY      OB History   No obstetric history on file.      Home Medications    Prior to Admission medications   Medication Sig Start Date End Date Taking? Authorizing Provider  azithromycin (ZITHROMAX Z-PAK) 250 MG tablet Take 2 tabs today; then begin one tab once daily for 4 more days. 12/15/22  Yes Lattie Haw, MD  SEMAGLUTIDE,0.25 OR 0.5MG /DOS, [place] Inject 0.25 mg into the skin once a week. Semaglutide 2.5 mg/ml + Piridoxine   Yes [provider]  albuterol (VENTOLIN HFA) 108 (90 Base) MCG/ACT inhaler Inhale 2 puffs into the lungs every 6 (six) hours as needed for wheezing or shortness of breath (Cough). Patient taking differently: Inhale 2 puffs into the lungs every 6 (six) hours as needed for wheezing or shortness of breath (Cough). Semaglutide 2.5 mg/ml + Piridoxine 10 mg/ml. Available in 2 ml vials. Administer 0.3 mg (12 units) subcutaneously once weekly x 2 weeks, then increase by 0.3 mg (12 units). May increase by 0.3 mg every 2 weeks until reaching max dose of 1.25 mg 06/11/22   Theadora Rama Scales, PA-C  fluticasone (FLONASE) 50 MCG/ACT nasal spray Place 1 spray into both nostrils daily. Begin by using 2 sprays in each nare daily for 3 to 5 days, then decrease to 1 spray in each nare daily. 06/11/22   Theadora Rama Scales, PA-C  loratadine (CLARITIN) 10 MG tablet Take 1 tablet (10 mg total) by mouth daily. 06/11/22 12/08/22  Theadora Rama Scales, PA-C    Family  History Family History  Problem Relation Age of Onset   Fibromyalgia Mother    Rheum arthritis Mother    Bladder Cancer Mother    Diabetes Father    Hypertension Father    Macular degeneration Father     Social History Social History   Tobacco Use   Smoking status: Never   Smokeless tobacco: Current  Substance Use Topics   Alcohol use: Yes   Drug use: No     Allergies   Ibuprofen   Review of Systems Review of Systems   Physical Exam Triage Vital Signs ED Triage Vitals [12/15/22 1744]  Encounter Vitals Group     BP 118/82     Systolic BP Percentile      Diastolic BP Percentile      Pulse Rate (!) 116     Resp 17     Temp 98.3 F (36.8 C)     Temp Source Oral     SpO2 96 %     Weight      Height      Head Circumference      Peak Flow      Pain Score 6     Pain Loc      Pain Education      Exclude from Growth Chart    No  data found.  Updated Vital Signs BP 118/82 (BP Location: Right Arm)   Pulse (!) 116   Temp 98.3 F (36.8 C) (Oral)   Resp 17   LMP 11/06/2022   SpO2 96%   Visual Acuity Right Eye Distance:   Left Eye Distance:   Bilateral Distance:    Right Eye Near:   Left Eye Near:    Bilateral Near:     Physical Exam   UC Treatments / Results  Labs (all labs ordered are listed, but only abnormal results are displayed) Labs Reviewed  POC SARS CORONAVIRUS 2 AG -  ED - Abnormal; Notable for the following components:      Result Value   SARS Coronavirus 2 Ag Positive (*)    All other components within normal limits  POC INFLUENZA A AND B ANTIGEN (URGENT CARE ONLY)    EKG   Radiology No results found.  Procedures Procedures (including critical care time)  Medications Ordered in UC Medications - No data to display  Initial Impression / Assessment and Plan / UC Course  I have reviewed the triage vital signs and the nursing notes.  Pertinent labs & imaging results that were available during my care of the patient were  reviewed by me and considered in my medical decision making (see chart for details).    Begin Z-pack. Followup with Family Doctor if not improved in one week.   Final Clinical Impressions(s) / UC Diagnoses   Final diagnoses:  COVID-19 virus infection     Discharge Instructions      Increase fluid intake.  Get adequate rest.   May use Afrin nasal spray (or generic oxymetazoline) each morning for about 5 days and then discontinue.  Also recommend using saline nasal spray several times daily and saline nasal irrigation (AYR is a common brand).  Use Flonase nasal spray each morning after using Afrin nasal spray and saline nasal irrigation. Try warm salt water gargles for sore throat.  Stop all antihistamines for now, and other non-prescription cough/cold preparations. May take Delsym Cough Suppressant ("12 Hour Cough Relief") at bedtime for nighttime cough.   If symptoms become significantly worse during the night or over the weekend, proceed to the local emergency room.     ED Prescriptions     Medication Sig Dispense Auth. Provider   azithromycin (ZITHROMAX Z-PAK) 250 MG tablet Take 2 tabs today; then begin one tab once daily for 4 more days. 6 tablet Lattie Haw, MD      PDMP not reviewed this encounter.

## 2022-12-15 NOTE — Discharge Instructions (Signed)
Increase fluid intake.  Get adequate rest.   May use Afrin nasal spray (or generic oxymetazoline) each morning for about 5 days and then discontinue.  Also recommend using saline nasal spray several times daily and saline nasal irrigation (AYR is a common brand).  Use Flonase nasal spray each morning after using Afrin nasal spray and saline nasal irrigation. Try warm salt water gargles for sore throat.  Stop all antihistamines for now, and other non-prescription cough/cold preparations. May take Delsym Cough Suppressant ("12 Hour Cough Relief") at bedtime for nighttime cough.   If symptoms become significantly worse during the night or over the weekend, proceed to the local emergency room.

## 2024-05-07 ENCOUNTER — Ambulatory Visit
Admission: RE | Admit: 2024-05-07 | Discharge: 2024-05-07 | Disposition: A | Discharge status: Home / Self Care | Attending: Family Medicine | Admitting: Family Medicine

## 2024-05-07 VITALS — BP 119/73 | HR 103 | Temp 98.3°F | Resp 18 | Ht 59.0 in | Wt 290.0 lb

## 2024-05-07 DIAGNOSIS — N3001 Acute cystitis with hematuria: Secondary | ICD-10-CM | POA: Diagnosis not present

## 2024-05-07 LAB — POCT URINE DIPSTICK
Glucose, UA: 250 mg/dL — AB
Nitrite, UA: POSITIVE — AB
Protein Ur, POC: >=300 mg/dL — AB
Spec Grav, UA: 1.01
Urobilinogen, UA: >=8 U/dL — AB
pH, UA: 5

## 2024-05-07 MED ORDER — CEFDINIR 300 MG PO CAPS: 300.0000 mg | ORAL_CAPSULE | Freq: Two times a day (BID) | ORAL | 0 refills | Status: AC

## 2024-05-07 NOTE — Discharge Instructions (Addendum)
 Advised patient take medication as directed with food to completion.  Encouraged increase daily water intake to 64 ounces per day while taking this medication.  Advised we will follow-up with urine culture results once received.  Advised if symptoms worsen and are unresolved please follow-up with your PCP or here for further evaluation.

## 2024-05-07 NOTE — ED Triage Notes (Signed)
 Patient c/o possible UTI, frequency, urgency, dysuria and lower abdominal pain x 4 days.  Patient has been taken AZO unsure if there's any hematuria.  Afebrile.

## 2024-05-07 NOTE — ED Provider Notes (Signed)
 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 I currently have one and it is severely bothering me, - Entered by patient    HPI Lillionna Nabi is a 34 y.o. female.   HPI 34 year old female presents with possible UTI.  Complains of frequency urgency and dysuria for 4 days.  PMH significant for obesity, asthma, and PCOS.  Past Medical History:  Diagnosis Date   Asthma    Obesity    PCOS (polycystic ovarian syndrome)     There are no active problems to display for this patient.   Past Surgical History:  Procedure Laterality Date   BLADDER SURGERY     TONSILLECTOMY      OB History   No obstetric history on file.      Home Medications    Prior to Admission medications  Medication Sig Start Date End Date Taking? Authorizing Provider  albuterol  (VENTOLIN  HFA) 108 (90 Base) MCG/ACT inhaler Inhale 2 puffs into the lungs every 6 (six) hours as needed for wheezing or shortness of breath (Cough). Patient taking differently: Inhale 2 puffs into the lungs every 6 (six) hours as needed for wheezing or shortness of breath (Cough). Semaglutide 2.5 mg/ml + Piridoxine 10 mg/ml. Available in 2 ml vials. Administer 0.3 mg (12 units) subcutaneously once weekly x 2 weeks, then increase by 0.3 mg (12 units). May increase by 0.3 mg every 2 weeks until reaching max dose of 1.25 mg 06/11/22  Yes Joesph Shaver Scales, PA-C  cefdinir  (OMNICEF ) 300 MG capsule Take 1 capsule (300 mg total) by mouth 2 (two) times daily for 7 days. 05/07/24 05/14/24 Yes Eufemio Strahm, [place], FNP  LO LOESTRIN FE 1 MG-10 MCG / 10 MCG tablet Take 1 tablet by mouth daily. 04/14/24  Yes [provider]  pantoprazole (PROTONIX) 40 MG tablet Take 40 mg by mouth 2 (two) times daily.   Yes [provider]  phentermine 15 MG capsule  Take 15 mg by mouth. 04/04/24  Yes [provider]  sertraline (ZOLOFT) 100 MG tablet Take 100 mg by mouth daily.   Yes [provider]  azithromycin  (ZITHROMAX  Z-PAK) 250 MG tablet Take 2 tabs today; then begin one tab once daily for 4 more days. 12/15/22   Pauline Garnette LABOR, MD  fluticasone  (FLONASE ) 50 MCG/ACT nasal spray Place 1 spray into both nostrils daily. Begin by using 2 sprays in each nare daily for 3 to 5 days, then decrease to 1 spray in each nare daily. 06/11/22   Joesph Shaver Scales, PA-C  loratadine  (CLARITIN ) 10 MG tablet Take 1 tablet (10 mg total) by mouth daily. 06/11/22 12/08/22  Joesph Shaver Scales, PA-C  SEMAGLUTIDE,0.25 OR 0.5MG /DOS, [place] Inject 0.25 mg into the skin once a week. Semaglutide 2.5 mg/ml + Piridoxine    [provider]    Family History Family History  Problem Relation Age of Onset   Fibromyalgia Mother    Rheum arthritis Mother    Bladder Cancer Mother    Diabetes Father    Hypertension Father    Macular degeneration Father     Social History Social History[1]   Allergies   Ibuprofen   Review of Systems Review of Systems  All other systems reviewed and are negative.    Physical Exam  Triage Vital Signs ED Triage Vitals  Encounter Vitals Group     BP 05/07/24 1611 119/73     Girls Systolic BP Percentile --      Girls Diastolic BP Percentile --      Boys Systolic BP Percentile --      Boys Diastolic BP Percentile --      Pulse Rate 05/07/24 1611 (!) 103     Resp 05/07/24 1611 18     Temp 05/07/24 1611 98.3 F (36.8 C)     Temp Source 05/07/24 1611 Oral     SpO2 05/07/24 1611 93 %     Weight 05/07/24 1608 290 lb (131.5 kg)     Height 05/07/24 1608 4' 11 (1.499 m)     Head Circumference --      Peak Flow --      Pain Score 05/07/24 1608 6     Pain Loc --      Pain Education --      Exclude from Growth Chart --    No data found.  Updated Vital Signs BP 119/73 (BP Location: Right Arm)   Pulse (!)  103   Temp 98.3 F (36.8 C) (Oral)   Resp 18   Ht 4' 11 (1.499 m)   Wt 290 lb (131.5 kg)   SpO2 93%   BMI 58.57 kg/m    Physical Exam Vitals and nursing note reviewed.  Constitutional:      Appearance: Normal appearance. She is normal weight.  HENT:     Head: Normocephalic and atraumatic.     Mouth/Throat:     Mouth: Mucous membranes are moist.     Pharynx: Oropharynx is clear.  Eyes:     Extraocular Movements: Extraocular movements intact.     Conjunctiva/sclera: Conjunctivae normal.     Pupils: Pupils are equal, round, and reactive to light.  Cardiovascular:     Heart sounds: Normal heart sounds.  Pulmonary:     Effort: Pulmonary effort is normal.     Breath sounds: Normal breath sounds. No wheezing, rhonchi or rales.  Musculoskeletal:        General: Normal range of motion.  Skin:    General: Skin is warm and dry.  Neurological:     General: No focal deficit present.     Mental Status: She is alert and oriented to person, place, and time. Mental status is at baseline.  Psychiatric:        Mood and Affect: Mood normal.        Behavior: Behavior normal.      UC Treatments / Results  Labs (all labs ordered are listed, but only abnormal results are displayed) Labs Reviewed  POCT URINE DIPSTICK - Abnormal; Notable for the following components:      Result Value   Color, UA orange (*)    Clarity, UA cloudy (*)    Glucose, UA =250 (*)    Bilirubin, UA moderate (*)    Ketones, POC UA small (15) (*)    Blood, UA small (*)    Protein Ur, POC >=300 (*)    Urobilinogen, UA >=8.0 (*)    Nitrite, UA Positive (*)    Leukocytes, UA Large (3+) (*)    All other components within normal limits  URINE CULTURE    EKG   Radiology No results found.  Procedures Procedures (including critical care time)  Medications Ordered in UC Medications - No data to display  Initial Impression / Assessment and Plan /  UC Course  I have reviewed the triage vital signs and the  nursing notes.  Pertinent labs & imaging results that were available during my care of the patient were reviewed by me and considered in my medical decision making (see chart for details).     MDM: 1.  Acute cystitis with hematuria-UA revealed above, urine culture ordered, Rx'd cefdinir  300 mg capsule: Take 1 capsule twice daily x 7 days. Advised patient take medication as directed with food to completion.  Encouraged increase daily water intake to 64 ounces per day while taking this medication.  Advised we will follow-up with urine culture results once received.  Advised if symptoms worsen and are unresolved please follow-up with your PCP or here for further evaluation.  Patient discharged home, hemodynamically stable. Final Clinical Impressions(s) / UC Diagnoses   Final diagnoses:  Acute cystitis with hematuria     Discharge Instructions      Advised patient take medication as directed with food to completion.  Encouraged increase daily water intake to 64 ounces per day while taking this medication.  Advised we will follow-up with urine culture results once received.  Advised if symptoms worsen and are unresolved please follow-up with your PCP or here for further evaluation.     ED Prescriptions     Medication Sig Dispense Auth. Provider   cefdinir  (OMNICEF ) 300 MG capsule Take 1 capsule (300 mg total) by mouth 2 (two) times daily for 7 days. 14 capsule Dezaray Shibuya, FNP      PDMP not reviewed this encounter.    [1]  Social History Tobacco Use   Smoking status: Never   Smokeless tobacco: Never  Vaping Use   Vaping status: Never Used  Substance Use Topics   Alcohol use: Yes   Drug use: No     Teddy Sharper, FNP 05/07/24 1646  "

## 2024-05-10 LAB — URINE CULTURE: Culture: >=100000 — AB

## 2024-05-13 ENCOUNTER — Ambulatory Visit (HOSPITAL_COMMUNITY): Payer: Self-pay
